# Patient Record
Sex: Female | Born: 1990 | Hispanic: Yes | Marital: Married | Smoking: Never smoker
Health system: Southern US, Community
[De-identification: ages and names within clinical notes are randomized; demographics above are authoritative.]

---

## 2013-07-24 ENCOUNTER — Ambulatory Visit: Payer: Self-pay | Admitting: Family Medicine

## 2013-08-26 ENCOUNTER — Encounter: Payer: Self-pay | Admitting: Maternal & Fetal Medicine

## 2013-11-29 ENCOUNTER — Inpatient Hospital Stay: Payer: Self-pay

## 2013-11-29 LAB — GC/CHLAMYDIA PROBE AMP

## 2013-11-29 LAB — CBC WITH DIFFERENTIAL/PLATELET
BASOS ABS: 0 10*3/uL (ref 0.0–0.1)
Basophil %: 0.4 %
Eosinophil #: 0 10*3/uL (ref 0.0–0.7)
Eosinophil %: 0.5 %
HCT: 39.5 % (ref 35.0–47.0)
HGB: 13.4 g/dL (ref 12.0–16.0)
LYMPHS ABS: 1.6 10*3/uL (ref 1.0–3.6)
Lymphocyte %: 18.3 %
MCH: 33.3 pg (ref 26.0–34.0)
MCHC: 34 g/dL (ref 32.0–36.0)
MCV: 98 fL (ref 80–100)
Monocyte #: 0.8 x10 3/mm (ref 0.2–0.9)
Monocyte %: 8.6 %
Neutrophil #: 6.4 10*3/uL (ref 1.4–6.5)
Neutrophil %: 72.2 %
PLATELETS: 128 10*3/uL — AB (ref 150–440)
RBC: 4.04 10*6/uL (ref 3.80–5.20)
RDW: 14.8 % — ABNORMAL HIGH (ref 11.5–14.5)
WBC: 8.8 10*3/uL (ref 3.6–11.0)

## 2013-11-30 LAB — HEMATOCRIT: HCT: 26.6 % — AB (ref 35.0–47.0)

## 2014-09-16 NOTE — H&P (Signed)
L&D Evaluation:  History:  HPI 24 yo G1P0 with LMP of 02/26/2013 & [facility] 12/03/2013 [facility] with no prenatal problems. Pt presents today for "SROM mof clear fluid at 0615am and no labor". Pt here now and developing UC's becoming stronger and more uncomfortable. Pt is currently walking to enc labor.   Presents with leaking fluid   Patient's Medical History No Chronic Illness   Patient's Surgical History none   Medications Pre Natal Vitamins   Allergies NKDA   Social History none   ROS:  ROS All systems were reviewed.  HEENT, CNS, GI, GU, Respiratory, CV, Renal and Musculoskeletal systems were found to be normal.   Exam:  Vital Signs stable   General no apparent distress   Mental Status clear   Chest clear   Heart normal sinus rhythm, no murmur/gallop/rubs   Abdomen gravid, non-tender   Estimated Fetal Weight Average for gestational age   Back no CVAT   Reflexes 1+   Pelvic 3/70/vtx   Mebranes Ruptured   Description clear   FHT normal rate with no decels, reactive NST   Skin dry   Lymph no lymphadenopathy   Impression:  Impression early labor   Plan:  Plan monitor contractions and for cervical change   Electronic Signatures: Sharee PimpleJones, Aubria Vanecek W (CNM)  (Signed 24-Jul-15 09:29)  Authored: L&D Evaluation   Last Updated: 24-Jul-15 09:29 by Sharee PimpleJones, Lamyra Malcolm W (CNM)

## 2018-12-18 ENCOUNTER — Other Ambulatory Visit: Payer: Self-pay

## 2018-12-18 DIAGNOSIS — Z20822 Contact with and (suspected) exposure to covid-19: Secondary | ICD-10-CM

## 2018-12-19 LAB — NOVEL CORONAVIRUS, NAA: SARS-CoV-2, NAA: NOT DETECTED

## 2019-02-19 LAB — OB RESULTS CONSOLE RPR: RPR: NONREACTIVE

## 2019-02-19 LAB — OB RESULTS CONSOLE VARICELLA ZOSTER ANTIBODY, IGG: Varicella: IMMUNE

## 2019-02-19 LAB — OB RESULTS CONSOLE RUBELLA ANTIBODY, IGM: Rubella: IMMUNE

## 2019-02-19 LAB — OB RESULTS CONSOLE GC/CHLAMYDIA
Chlamydia: NEGATIVE
Gonorrhea: NEGATIVE

## 2019-02-19 LAB — OB RESULTS CONSOLE HEPATITIS B SURFACE ANTIGEN: Hepatitis B Surface Ag: NEGATIVE

## 2019-02-19 LAB — OB RESULTS CONSOLE HIV ANTIBODY (ROUTINE TESTING): HIV: NONREACTIVE

## 2019-04-05 ENCOUNTER — Other Ambulatory Visit: Payer: Self-pay | Admitting: Family Medicine

## 2019-04-05 DIAGNOSIS — Z3482 Encounter for supervision of other normal pregnancy, second trimester: Secondary | ICD-10-CM

## 2019-04-15 ENCOUNTER — Ambulatory Visit
Admission: RE | Admit: 2019-04-15 | Discharge: 2019-04-15 | Disposition: A | Discharge status: Home / Self Care | Payer: Medicaid Other | Source: Ambulatory Visit | Attending: Family Medicine | Admitting: Family Medicine

## 2019-04-15 ENCOUNTER — Other Ambulatory Visit: Payer: Self-pay

## 2019-04-15 DIAGNOSIS — Z3482 Encounter for supervision of other normal pregnancy, second trimester: Secondary | ICD-10-CM | POA: Insufficient documentation

## 2019-05-10 NOTE — L&D Delivery Note (Addendum)
Delivery Note  Date of delivery: 08/31/2019 Estimated Date of Delivery: 09/05/19 Patient's last menstrual period was 11/29/2018. EGA: [redacted]w[redacted]d  First Stage: Augmentation : pitocin Analgesia /Anesthesia intrapartum: none PROM at 2315 on 4/23/221  Casey Floyd presented to L&D with PROM. She was initially expectantly managed, then augmented with pitocin.    Second Stage: Complete dilation at 1525 Onset of pushing at 1525 FHR second stage: category II Delivery at 1552 on 08/31/2019  She progressed to complete and had a spontaneous vaginal birth of a live female, pushing over an intact perineum. The fetal head was delivered in direct OA position with restitution to ROA. No nuchal cord. Anterior then posterior shoulders delivered spontaneously. Baby placed on mom's abdomen and attended to by transition RN. Cord clamped and cut when pulseless by father of the baby. Cord blood obtained for newborn labs.  Third Stage: Placenta delivered spontaneously Duncan intact with 3VC at 1558. Large gush of blood with placenta. Placenta disposition: routine disposal. Uterine tone firm with massage, bleeding brisk from periurethral and vaginal lacerations. Lidocaine injected for anesthesia. IV pitocin given for hemorrhage prophylaxis, along with misoprostol PR. Red rubber catheter used to drain bladder and left in place for periurethral repair. Periurethral laceration repaired with figure of 8 with good hemostasis. Attention then turned to vaginal and perineal lacerations. Right vaginal side wall laceration repaired with running sutures. 2nd degree perineal laceration repaired in the usual fashion.   Periurethral, vaginal, and perineal lacerations identified  Anesthesia for repair: lidocaine Repair: 3-0 Vicryl Rapide CT Quant. Blood Loss (mL): 1554  Complications: hemorrhage  Mom to postpartum.  Baby to Couplet care / Skin to Skin.  Newborn: Birth Weight: pending  Apgar Scores: 8, 9 Feeding  planned: breast and formula   Casey Floyd, CNM 08/31/2019 5:07 PM

## 2019-05-23 ENCOUNTER — Other Ambulatory Visit: Payer: Self-pay | Admitting: Family Medicine

## 2019-05-23 DIAGNOSIS — Z3482 Encounter for supervision of other normal pregnancy, second trimester: Secondary | ICD-10-CM

## 2019-05-28 ENCOUNTER — Ambulatory Visit
Admission: RE | Admit: 2019-05-28 | Discharge: 2019-05-28 | Disposition: A | Discharge status: Home / Self Care | Payer: Medicaid Other | Source: Ambulatory Visit | Attending: Family Medicine | Admitting: Family Medicine

## 2019-05-28 ENCOUNTER — Other Ambulatory Visit: Payer: Self-pay

## 2019-05-28 DIAGNOSIS — Z3482 Encounter for supervision of other normal pregnancy, second trimester: Secondary | ICD-10-CM | POA: Diagnosis present

## 2019-08-31 ENCOUNTER — Other Ambulatory Visit: Payer: Self-pay

## 2019-08-31 ENCOUNTER — Inpatient Hospital Stay
Admission: EM | Admit: 2019-08-31 | Discharge: 2019-09-02 | DRG: 806 | Disposition: A | Discharge status: Home / Self Care | Payer: Medicaid Other | Attending: Certified Nurse Midwife | Admitting: Certified Nurse Midwife

## 2019-08-31 DIAGNOSIS — D62 Acute posthemorrhagic anemia: Secondary | ICD-10-CM | POA: Diagnosis not present

## 2019-08-31 DIAGNOSIS — O4290 Premature rupture of membranes, unspecified as to length of time between rupture and onset of labor, unspecified weeks of gestation: Secondary | ICD-10-CM | POA: Diagnosis present

## 2019-08-31 DIAGNOSIS — Z3A39 39 weeks gestation of pregnancy: Secondary | ICD-10-CM

## 2019-08-31 DIAGNOSIS — O9081 Anemia of the puerperium: Secondary | ICD-10-CM | POA: Diagnosis not present

## 2019-08-31 DIAGNOSIS — Z20822 Contact with and (suspected) exposure to covid-19: Secondary | ICD-10-CM | POA: Diagnosis present

## 2019-08-31 DIAGNOSIS — O4292 Full-term premature rupture of membranes, unspecified as to length of time between rupture and onset of labor: Secondary | ICD-10-CM | POA: Diagnosis present

## 2019-08-31 LAB — CBC
HCT: 35.5 % — ABNORMAL LOW (ref 36.0–46.0)
Hemoglobin: 11.7 g/dL — ABNORMAL LOW (ref 12.0–15.0)
MCH: 31.7 pg (ref 26.0–34.0)
MCHC: 33 g/dL (ref 30.0–36.0)
MCV: 96.2 fL (ref 80.0–100.0)
Platelets: 170 10*3/uL (ref 150–400)
RBC: 3.69 MIL/uL — ABNORMAL LOW (ref 3.87–5.11)
RDW: 15.5 % (ref 11.5–15.5)
WBC: 9.1 10*3/uL (ref 4.0–10.5)
nRBC: 0 % (ref 0.0–0.2)

## 2019-08-31 LAB — RESPIRATORY PANEL BY RT PCR (FLU A&B, COVID)
Influenza A by PCR: NEGATIVE
Influenza B by PCR: NEGATIVE
SARS Coronavirus 2 by RT PCR: NEGATIVE

## 2019-08-31 LAB — TYPE AND SCREEN
ABO/RH(D): O POS
Antibody Screen: NEGATIVE

## 2019-08-31 MED ORDER — OXYTOCIN 40 UNITS IN NORMAL SALINE INFUSION - SIMPLE MED
1.0000 m[IU]/min | INTRAVENOUS | Status: DC
  Administered 2019-08-31: 1 m[IU]/min via INTRAVENOUS
  Filled 2019-08-31: qty 1000

## 2019-08-31 MED ORDER — LACTATED RINGERS IV SOLN: INTRAVENOUS | Status: DC

## 2019-08-31 MED ORDER — LACTATED RINGERS IV SOLN: 500.0000 mL | INTRAVENOUS | Status: DC | PRN

## 2019-08-31 MED ORDER — SENNOSIDES-DOCUSATE SODIUM 8.6-50 MG PO TABS
2.0000 | ORAL_TABLET | ORAL | Status: DC
  Administered 2019-09-01: 2 via ORAL
  Filled 2019-08-31: qty 2

## 2019-08-31 MED ORDER — MISOPROSTOL 200 MCG PO TABS
ORAL_TABLET | ORAL | Status: AC
  Administered 2019-08-31: 800 ug
  Filled 2019-08-31: qty 4

## 2019-08-31 MED ORDER — ONDANSETRON HCL 4 MG/2ML IJ SOLN: 4.0000 mg | Freq: Four times a day (QID) | INTRAMUSCULAR | Status: DC | PRN

## 2019-08-31 MED ORDER — OXYTOCIN 10 UNIT/ML IJ SOLN
INTRAMUSCULAR | Status: AC
  Filled 2019-08-31: qty 2

## 2019-08-31 MED ORDER — ACETAMINOPHEN 325 MG PO TABS
650.0000 mg | ORAL_TABLET | ORAL | Status: DC | PRN
  Administered 2019-09-01 – 2019-09-02 (×3): 650 mg via ORAL
  Filled 2019-08-31 (×3): qty 2

## 2019-08-31 MED ORDER — DIPHENHYDRAMINE HCL 25 MG PO CAPS: 25.0000 mg | ORAL_CAPSULE | Freq: Four times a day (QID) | ORAL | Status: DC | PRN

## 2019-08-31 MED ORDER — MISOPROSTOL 200 MCG PO TABS: 800.0000 ug | ORAL_TABLET | Freq: Once | ORAL | Status: DC

## 2019-08-31 MED ORDER — IBUPROFEN 600 MG PO TABS
600.0000 mg | ORAL_TABLET | Freq: Four times a day (QID) | ORAL | Status: DC
  Administered 2019-08-31 – 2019-09-02 (×7): 600 mg via ORAL
  Filled 2019-08-31 (×7): qty 1

## 2019-08-31 MED ORDER — AMMONIA AROMATIC IN INHA
RESPIRATORY_TRACT | Status: AC
  Filled 2019-08-31: qty 10

## 2019-08-31 MED ORDER — ONDANSETRON HCL 4 MG/2ML IJ SOLN: 4.0000 mg | INTRAMUSCULAR | Status: DC | PRN

## 2019-08-31 MED ORDER — COCONUT OIL OIL
1.0000 "application " | TOPICAL_OIL | Status: DC | PRN
  Administered 2019-09-01: 1 via TOPICAL
  Filled 2019-08-31: qty 120

## 2019-08-31 MED ORDER — BENZOCAINE-MENTHOL 20-0.5 % EX AERO: 1.0000 "application " | INHALATION_SPRAY | CUTANEOUS | Status: DC | PRN

## 2019-08-31 MED ORDER — OXYTOCIN BOLUS FROM INFUSION
500.0000 mL | Freq: Once | INTRAVENOUS | Status: AC
  Administered 2019-08-31: 500 mL via INTRAVENOUS

## 2019-08-31 MED ORDER — ONDANSETRON HCL 4 MG PO TABS: 4.0000 mg | ORAL_TABLET | ORAL | Status: DC | PRN

## 2019-08-31 MED ORDER — FERROUS SULFATE 325 (65 FE) MG PO TABS
325.0000 mg | ORAL_TABLET | Freq: Two times a day (BID) | ORAL | Status: DC
  Administered 2019-08-31 – 2019-09-02 (×4): 325 mg via ORAL
  Filled 2019-08-31 (×4): qty 1

## 2019-08-31 MED ORDER — SOD CITRATE-CITRIC ACID 500-334 MG/5ML PO SOLN: 30.0000 mL | ORAL | Status: DC | PRN

## 2019-08-31 MED ORDER — LIDOCAINE HCL (PF) 1 % IJ SOLN
30.0000 mL | INTRAMUSCULAR | Status: AC | PRN
  Administered 2019-08-31: 30 mL via SUBCUTANEOUS
  Filled 2019-08-31: qty 30

## 2019-08-31 MED ORDER — ACETAMINOPHEN 500 MG PO TABS: 1000.0000 mg | ORAL_TABLET | Freq: Four times a day (QID) | ORAL | Status: DC | PRN

## 2019-08-31 MED ORDER — BUTORPHANOL TARTRATE 1 MG/ML IJ SOLN: 1.0000 mg | INTRAMUSCULAR | Status: DC | PRN

## 2019-08-31 MED ORDER — PRENATAL MULTIVITAMIN CH
1.0000 | ORAL_TABLET | Freq: Every day | ORAL | Status: DC
  Filled 2019-08-31: qty 1

## 2019-08-31 MED ORDER — SIMETHICONE 80 MG PO CHEW: 160.0000 mg | CHEWABLE_TABLET | ORAL | Status: DC | PRN

## 2019-08-31 MED ORDER — WITCH HAZEL-GLYCERIN EX PADS
1.0000 "application " | MEDICATED_PAD | CUTANEOUS | Status: DC
  Administered 2019-08-31: 1 via TOPICAL
  Filled 2019-08-31: qty 100

## 2019-08-31 MED ORDER — DIBUCAINE (PERIANAL) 1 % EX OINT
1.0000 "application " | TOPICAL_OINTMENT | CUTANEOUS | Status: DC | PRN
  Filled 2019-08-31: qty 28

## 2019-08-31 MED ORDER — BENZOCAINE-MENTHOL 20-0.5 % EX AERO
INHALATION_SPRAY | CUTANEOUS | Status: AC
  Filled 2019-08-31: qty 56

## 2019-08-31 MED ORDER — OXYTOCIN 40 UNITS IN NORMAL SALINE INFUSION - SIMPLE MED
2.5000 [IU]/h | INTRAVENOUS | Status: DC
  Filled 2019-08-31: qty 1000

## 2019-08-31 NOTE — Progress Notes (Signed)
Labor Progress Note  Casey Floyd is a 29 y.o. G2P1001 at [redacted]w[redacted]d by LMP c/w [redacted]w[redacted]d ultrasound admitted for rupture of membranes  Subjective:  Starting to feel stronger contractions  Objective: BP 116/77   Pulse (!) 134   Temp 98.3 F (36.8 C) (Oral)   Resp 16   Ht 5\' 2"  (1.575 m)   Wt 75.6 kg   LMP 11/29/2018   SpO2 99%   BMI 30.47 kg/m   Fetal Assessment: FHT:  FHR: 140 bpm, variability: moderate,  accelerations: present, decelerations: variable x1 Category/reactivity:  Category II UC:   regular, every 2-4 minutes SVE:    Dilation: 4-5cm  Effacement: 680%  Station:  -1  Consistency: soft  Position: middle  Membrane status: PROM 08/30/2019 2315 Amniotic color: clear  Labs: Lab Results  Component Value Date   WBC 9.1 08/31/2019   HGB 11.7 (L) 08/31/2019   HCT 35.5 (L) 08/31/2019   MCV 96.2 08/31/2019   PLT 170 08/31/2019    Assessment / Plan: PROM, now in labor  Labor: PROM 08/30/2019 at 2315 (x13.5 hours). Contracting regularly, cervical change since last check. Pitocin infusing at 57mu/min. Continue to titrate PRN per protocol.   Fetal Wellbeing:  Category II for variable decel x1, overall reassuring Pain Control:  Labor support without medications I/D:  n/a Anticipated MOD:  NSVD  4m, CNM 08/31/2019, 12:41 PM

## 2019-08-31 NOTE — H&P (Signed)
OB History & Physical   History of Present Illness:  Chief Complaint:   HPI:  Shadae SHANLEY FURLOUGH is a 29 y.o. G25P1001 female at [redacted]w[redacted]d dated by LMP, c/w [place] at [redacted]w[redacted]d.  She presents to L&D for PROM.   Active FM Denies contractions or cramping  SROM @ 2315 on 08/30/2019  Pregnancy Issues: 1. Family history of Trisomy 55 - cousin was born with Trisomy 69, declined genetic screening  2. Anemia in pregnancy   Patient Active Problem List   Diagnosis Date Noted  . PROM (premature rupture of membranes) 08/31/2019    Maternal Medical History:  History reviewed. No pertinent past medical history.  History reviewed. No pertinent surgical history.  No Known Allergies  Prior to Admission medications   Medication Sig Start Date End Date Taking? Authorizing Provider  ferrous sulfate 325 (65 FE) MG tablet Take 325 mg by mouth daily with breakfast.   Yes [provider]   Pren[facility] care site:  [facility]   Social History: She  reports that she has never smoked. She has never used smokeless tobacco. She reports that she does not drink alcohol or use drugs.  Family History: family history includes Cancer in her paternal grandfather; Diabetes in her paternal grandfather.   Review of Systems: A full review of systems was performed and negative except as noted in the HPI.     Physical Exam:  Vital Signs: BP 121/70 (BP Location: Left Arm)   Pulse 100   Temp 98.3 F (36.8 C) (Oral)   Resp 16   Ht 5\' 2"  (1.575 m)   Wt 75.6 kg   LMP 11/29/2018   SpO2 99%   BMI 30.47 kg/m   General: no acute distress.  HEENT: normocephalic, atraumatic Heart: regular rate & rhythm.  No murmurs/rubs/gallops Lungs: clear to auscultation bilaterally, normal respiratory effort Abdomen: soft, gravid, non-tender;  EFW: 7lbs  Pelvic:   External: Normal external female genitalia  Cervix: Dilation: 3.5 / Effacement (%): 60 / Station: -2    Extremities: non-tender, symmetric,  No edema bilaterally.  DTRs: 2+/2+  Neurologic: Alert & oriented x 3.    Pertinent Results:  Prenatal Labs: Blood type/Rh O pos  Antibody screen neg  Rubella Immune  Varicella Immune  RPR NR  HBsAg Neg  HIV NR  GC neg  Chlamydia neg  Genetic screening Declined   1 hour GTT 68  3 hour GTT NA  GBS Neg   Flu vaccine - given 02/18/2019 TDap vaccine - given at 28 week visit   FHT: 135 bpm, mod variability, accels present, decels absent  TOCO: Occasional  SVE:  Dilation: 3.5 / Effacement (%): 60 / Station: -2    Cephalic by leopolds, confirmed by SVE   No results found.  Assessment:  Khadeejah GALILEAH PIGGEE is a 29 y.o. G38P1001 female at [redacted]w[redacted]d with PROM.   Plan:  1. Admit to Labor & Delivery; consents reviewed and obtained - Covid admission screening per protocol   2. Fetal Well being  - Fetal Tracing: cat 1 - Group B Streptococcus ppx indicated: N/A, GBS neg - Presentation: vertex confirmed by SVE    3. Routine OB: - Prenatal labs reviewed, as above - Rh POS  - CBC, T&S, RPR on admit - Regular diet, saline lock  4. Monitoring of Labor -  External toco in place -  Pelvis proven to 8lbs 5oz -  Reviewed risk/benefits of expectant versus active management.   -  Consents to expectant  management, would prefer for labor to start spontaneously.   -  Open to augmenting with pitocin if remote from labor in 6-8 hours  -  Plan for continuous fetal monitoring  -  Maternal pain control as desired; does not plan for epidural for labor  - Anticipate vaginal delivery  5. Post Partum Planning: - Infant feeding: Breast and formula  - Contraception: TBD   Minda Meo, CNM 08/31/19 1:43 AM

## 2019-08-31 NOTE — Progress Notes (Signed)
Pt rating ctx pain 8/10 scale but still not wanting any pain interventions. She is breathing well through ctx and utilizing position changes to get cope with labor pain. CNM in department and updated on status.

## 2019-08-31 NOTE — Lactation Note (Signed)
This note was copied from a baby's chart. Lactation Consultation Note  Patient Name: Casey Floyd [id number] Date: 08/31/2019 Reason for consult: Initial assessment;Mother's request;Term;Other (Comment)  Mom was still having perineum repaired when entered room.  Casey Floyd is fussy and rooting for the breast.  Gently eased him closer to the breast where after several attempts, he latched well and began good, rhythmic sucking which mom could feel as strong tugs at the breast.  Mom denies any nipple or breast pain, just a little tenderness on initial latch.  He breast fed on the right breast for 24 minutes and came off on his own.  Shortly after coming off the breast, he started fussing and rooting again.  Gently turned him to the left breast, where he once again latched after a few attempts and began strong, rhytyhmic sucking.  Swallows pointed out to mom.  After 30 minutes, Casey Floyd was sustaining the latch on the left breast and sucking intermittently.  Mom was willing to leave him at the breast.  Mom reports having latch issues with her first baby, but he went on to breast feed for 2 years.  Reviewed feeding cues, normal newborn stomach size, supply and demand, normal course of lactation and routine newborn feeding patterns.  Mom on admission put down as her feeding choice to breast and bottle feed.  Discussed risks of introducing the bottle or any artificial nipple too early and encouraged her to just  breast feed.  Praised mom for her commitment to breast feed Casey Floyd.  Encouraged mom to ask for help if needed. Maternal Data Formula Feeding for Exclusion: No Has patient been taught Hand Expression?: Yes Does the patient have breastfeeding experience prior to this delivery?: Yes  Feeding Feeding Type: Breast Fed  LATCH Score Latch: Repeated attempts needed to sustain latch, nipple held in mouth throughout feeding, stimulation needed to elicit sucking reflex.  Audible Swallowing: A few with  stimulation  Type of Nipple: Everted at rest and after stimulation  Comfort (Breast/Nipple): Filling, red/small blisters or bruises, mild/mod discomfort  Hold (Positioning): Assistance needed to correctly position infant at breast and maintain latch.  LATCH Score: 6  Interventions Interventions: Breast feeding basics reviewed;Assisted with latch;Skin to skin;Breast massage;Hand express;Reverse pressure;Breast compression;Adjust position;Support pillows;Position options  Lactation Tools Discussed/Used WIC Program: Yes   Consult Status Consult Status: Follow-up Date: 08/31/19 Follow-up type: Call as needed    Louis Meckel 08/31/2019, 6:16 PM

## 2019-08-31 NOTE — Progress Notes (Signed)
Labor Progress Note  Casey Floyd is a 29 y.o. G2P1001 at [redacted]w[redacted]d by LMP c/w [redacted]w[redacted]d ultrasound admitted for rupture of membranes  Subjective:  Feeling strong contractions. Feeling pressure in her bottom during contractions.   Objective: BP 134/73   Pulse 76   Temp 98.3 F (36.8 C) (Oral)   Resp 16   Ht 5\' 2"  (1.575 m)   Wt 75.6 kg   LMP 11/29/2018   SpO2 99%   BMI 30.47 kg/m   Fetal Assessment: FHT:  FHR: 140 bpm, variability: moderate,  accelerations: present, decelerations: variable x1 Category/reactivity:  Category II UC:   regular, every 2-3 minutes SVE:    Dilation: 8cm  Effacement: 90%  Station:  -1  Consistency: soft  Position: middle  Membrane status: PROM 08/30/2019 2315 Amniotic color: clear  Labs: Lab Results  Component Value Date   WBC 9.1 08/31/2019   HGB 11.7 (L) 08/31/2019   HCT 35.5 (L) 08/31/2019   MCV 96.2 08/31/2019   PLT 170 08/31/2019    Assessment / Plan: PROM, now in labor  Labor: PROM 08/30/2019 at 2315 (x15 hours). Active labor. Pitocin infusing at 28mu/min. Fetal Wellbeing:  Category II for variable decel x1, overall reassuring Pain Control:  Labor support without medications I/D:  n/a Anticipated MOD:  NSVD  9m, CNM 08/31/2019, 2:25 PM

## 2019-08-31 NOTE — Discharge Summary (Signed)
Obstetric Discharge Summary   Patient Name: Casey Floyd DOB: Jun 19, 1990 MRN: [id number]  Date of Admission: 08/31/2019 Date of Delivery: 08/31/2019 Delivered by: Genia Del, CNM Date of Discharge: 09/02/2019  Primary OB: [facility]  XQJ:[id number]'C last menstrual period was 11/29/2018. EDC Estimated Date of Delivery: 09/05/19 Gestational Age at Delivery: [redacted]w[redacted]d   Antepartum complications:  1. Family history of Trisomy 79 - cousin was born with Trisomy 84, declined genetic screening  2. Anemia in pregnancy   Admitting Diagnosis: PROM  Secondary Diagnoses: Patient Active Problem List   Diagnosis Date Noted  . NSVD (normal spontaneous vaginal delivery) 09/02/2019  . Acute blood loss anemia 09/02/2019    Augmentation: Pitocin Complications: Hemorrhage>1058mL Intrapartum complications/course: She presented to L&D with PROM. She was initially expectantly managed, then augmented with pitocin. She progressed to complete and had a spontaneous vaginal birth of a live female, pushing over an intact perineum. The fetal head was delivered in direct OA position with restitution to ROA. No nuchal cord. Anterior then posterior shoulders delivered spontaneously. Baby placed on mom's abdomen and attended to by transition RN. Cord clamped and cut when pulseless by father of the baby. Cord blood obtained for newborn labs. Placenta delivered spontaneously Duncan intact with 3VC at 1558. Large gush of blood with placenta. Placenta disposition: routine disposal. Uterine tone firm with massage, bleeding brisk from periurethral and vaginal lacerations. Lidocaine injected for anesthesia. IV pitocin given for hemorrhage prophylaxis, along with misoprostol PR. Red rubber catheter used to drain bladder and left in place for periurethral repair. Periurethral laceration repaired with figure of 8 with good hemostasis. Attention then turned to vaginal and perineal lacerations. Right  vaginal side wall laceration repaired with running sutures. 2nd degree perineal laceration repaired in the usual fashion.   Quant. Blood Loss (mL): 1554 Delivery Type: spontaneous vaginal delivery Anesthesia: none Placenta: spontaneous Laceration: periurethral, vaginal, 2nd degree perineal Episiotomy: none  Newborn Data: Live born female "Casey Floyd" Birth Weight: 3960g 8lbs 11.7oz APGAR: 8, 9  Newborn Delivery   Birth date/time: 08/31/2019 15:52:00 Delivery type: Vaginal, Spontaneous       Postpartum Course  Patient had an uncomplicated postpartum course.  By time of discharge on PPD#2, her pain was controlled on oral pain medications; she had appropriate lochia and was ambulating, voiding without difficulty and tolerating regular diet.  She was deemed stable for discharge to home.       EdinburghInocente Salles Postnatal Depression Scale Screening Tool 09/01/2019 08/31/2019  I have been able to laugh and see the funny side of things. 0 (No Data)  I have looked forward with enjoyment to things. 0 -  I have blamed myself unnecessarily when things went wrong. 0 -  I have been anxious or worried for no good reason. 0 -  I have felt scared or panicky for no good reason. 0 -  Things have been getting on top of me. 0 -  I have been so unhappy that I have had difficulty sleeping. 0 -  I have felt sad or miserable. 0 -  I have been so unhappy that I have been crying. 0 -  The thought of harming myself has occurred to me. 0 -  Edinburgh Postnatal Depression Scale Total 0 -     Labs: CBC Latest Ref Rng & Units 09/02/2019 09/01/2019 08/31/2019  WBC 4.0 - 10.5 K/uL 10.4 10.4 9.1  Hemoglobin 12.0 - 15.0 g/dL 8.1(L) 8.9(L) 11.7(L)  Hematocrit 36.0 - 46.0 % 25.4(L)  27.1(L) 35.5(L)  Platelets 150 - 400 K/uL 142(L) 144(L) 170   O POS  Physical exam:  BP 105/60 (BP Location: Right Arm)   Pulse 73   Temp 98.4 F (36.9 C) (Axillary)   Resp 20   Ht 5\' 2"  (1.575 m)   Wt 75.6 kg   LMP  11/29/2018   SpO2 99% Comment: Room Air  Breastfeeding Unknown   BMI 30.47 kg/m  General: alert and no distress Pulm: normal respiratory effort Lochia: appropriate Abdomen: soft, NT Uterine Fundus: firm, below umbilicus Extremities: No evidence of DVT seen on physical exam. No lower extremity edema.   Disposition: stable, discharge to home Baby Feeding: breastmilk & formula Baby Disposition: home with mom  Contraception: TBD  Prenatal Labs:  Blood type/Rh O+  Antibody screen neg  Rubella Immune  Varicella Immune  RPR NR  HBsAg Neg  HIV NR  GC neg  Chlamydia neg  Genetic screening declined  1 hour GTT 68  3 hour GTT n/a  GBS negative   Rh Immune globulin given: n/a Rubella vaccine given: n/a Varicella vaccine given: n/a Tdap vaccine given in AP or PP setting: at 28 week visit AP Flu vaccine given in AP or PP setting: 81/27/517  Plan:  Essance TONYA WANTZ was discharged to home in good condition. Follow-up appointment at [place] with delivery provider in 6 weeks  Discharge Instructions: Per After Visit Summary. Activity: Advance as tolerated. Pelvic rest for 6 weeks.   Diet: Regular Discharge Medications: Allergies as of 09/02/2019   No Known Allergies     Medication List    TAKE these medications   acetaminophen 325 MG tablet Commonly known as: Tylenol Take 2 tablets (650 mg total) by mouth every 6 (six) hours as needed (for pain scale < 4).   docusate sodium 100 MG capsule Commonly known as: Colace Take 1 capsule (100 mg total) by mouth daily as needed for mild constipation or moderate constipation.   ferrous sulfate 325 (65 FE) MG tablet Take 1 tablet (325 mg total) by mouth 2 (two) times daily with a meal. What changed: when to take this   ibuprofen 600 MG tablet Commonly known as: ADVIL Take 1 tablet (600 mg total) by mouth every 6 (six) hours as needed for mild pain or moderate pain.   prenatal multivitamin Tabs tablet Take 1  tablet by mouth daily at 12 noon.      Outpatient follow up:  Follow-up Information    Lisette Grinder, CNM. Schedule an appointment as soon as possible for a visit in 6 week(s).   Specialty: Certified Nurse Midwife Why: For routine postpartum visit Contact information: [address] [address] [phone]            Signed: Clydene Laming 09/02/19 9:13 AM

## 2019-08-31 NOTE — OB Triage Note (Signed)
Pt presents c/o SROM around 11:15 p.m. clear fluid with light pink tinge. Pt denies contractions. Reports positive fetal movement. Vitals WNL. Will continue to monitor.

## 2019-08-31 NOTE — Discharge Instructions (Signed)

## 2019-08-31 NOTE — Progress Notes (Signed)
Fetal monitoring removed per verbal order from provider to allow patient to rest. Will continue to monitor.

## 2019-08-31 NOTE — Progress Notes (Signed)
Wireless monitors and belly band applied to allow pt to move more freely. Pt currently rating pain 2/10 and denies any needs at this time.

## 2019-08-31 NOTE — Progress Notes (Signed)
Pt up to bathroom. Wireless monitor tracing maternal HR.

## 2019-08-31 NOTE — Progress Notes (Signed)
Labor Progress Note  Casey Floyd is a 29 y.o. G2P1001 at [redacted]w[redacted]d by LMP c/w [redacted]w[redacted]d ultrasound admitted for rupture of membranes  Subjective:  Feeling contractions. Starting to get a little bit stronger.   Objective: BP 118/68   Pulse 90   Temp 98.8 F (37.1 C) (Oral)   Resp 14   Ht 5\' 2"  (1.575 m)   Wt 75.6 kg   LMP 11/29/2018   SpO2 99%   BMI 30.47 kg/m   Fetal Assessment: FHT:  FHR: 135 bpm, variability: moderate,  accelerations:  Absent,  decelerations:  Absent Category/reactivity:  Category I UC:   irregular, every 2-9 minutes SVE:    Dilation: 3-4cm  Effacement: 60%  Station:  -2  Consistency: medium  Position: posterior  Membrane status: PROM 08/30/2019 2315 Amniotic color: clear  Labs: Lab Results  Component Value Date   WBC 9.1 08/31/2019   HGB 11.7 (L) 08/31/2019   HCT 35.5 (L) 08/31/2019   MCV 96.2 08/31/2019   PLT 170 08/31/2019    Assessment / Plan: PROM  Labor: PROM 08/30/2019 at 2315 (almost 10 hours ago). Contracting mildly and irregularly. Cervix unchanged since admission. Plan to start pitocin for augmentation and titrate per protocol to adequate contraction pattern.  Fetal Wellbeing:  Category I Pain Control:  Labor support without medications I/D:  n/a Anticipated MOD:  NSVD  09/01/2019, CNM 08/31/2019, 8:58 AM

## 2019-09-01 LAB — CBC
HCT: 27.1 % — ABNORMAL LOW (ref 36.0–46.0)
Hemoglobin: 8.9 g/dL — ABNORMAL LOW (ref 12.0–15.0)
MCH: 32.1 pg (ref 26.0–34.0)
MCHC: 32.8 g/dL (ref 30.0–36.0)
MCV: 97.8 fL (ref 80.0–100.0)
Platelets: 144 10*3/uL — ABNORMAL LOW (ref 150–400)
RBC: 2.77 MIL/uL — ABNORMAL LOW (ref 3.87–5.11)
RDW: 15.7 % — ABNORMAL HIGH (ref 11.5–15.5)
WBC: 10.4 10*3/uL (ref 4.0–10.5)
nRBC: 0 % (ref 0.0–0.2)

## 2019-09-01 LAB — RPR: RPR Ser Ql: NONREACTIVE

## 2019-09-01 MED ORDER — SODIUM CHLORIDE 0.9 % IV SOLN
200.0000 mg | Freq: Once | INTRAVENOUS | Status: AC
  Administered 2019-09-01: 200 mg via INTRAVENOUS
  Filled 2019-09-01: qty 10

## 2019-09-01 NOTE — Progress Notes (Signed)
Post Partum Day 1  Subjective: Doing well, no concerns. Ambulating without difficulty, pain managed with PO meds, tolerating regular diet, and voiding without difficulty. Some dizziness here and there when getting up.  No fever/chills, chest pain, shortness of breath, nausea/vomiting, or leg pain. No nipple or breast pain.   Objective: BP 104/62 (BP Location: Right Arm)   Pulse 75   Temp 98.3 F (36.8 C) (Oral)   Resp 20   Ht 5\' 2"  (1.575 m)   Wt 75.6 kg   LMP 11/29/2018   SpO2 99% Comment: Room Air  Breastfeeding Unknown   BMI 30.47 kg/m    Physical Exam:  General: alert, cooperative, appears stated age and no distress Breasts: soft/nontender CV: RRR Pulm: nl effort, CTABL Abdomen: soft, non-tender, active bowel sounds Uterine Fundus: firm Perineum: repair well approximated Lochia: appropriate DVT Evaluation: No evidence of DVT seen on physical exam. No cords or calf tenderness. No significant calf/ankle edema.  Recent Labs    08/31/19 0131 09/01/19 0706  HGB 11.7* 8.9*  HCT 35.5* 27.1*  WBC 9.1 10.4  PLT 170 144*    Assessment/Plan: 29 y.o. 09/03/19 postpartum day # 1  -Continue routine postpartum care -Lactation consult PRN for breastfeeding  -Acute blood loss anemia: appropriate hemoglobin drop, vital signs WNL, mildly symptomatic. continue PO ferrous sulfate BID with stool softeners, one dose of IV Venofer. Repeat CBC in the morning -Immunization status: all immunizations up to date  Disposition: Continue inpatient postpartum care   LOS: 1 day   [id number], CNM 09/01/2019, 10:31 AM   ----- 09/03/2019 Certified Nurse Midwife [facility]

## 2019-09-01 NOTE — Lactation Note (Signed)
This note was copied from a baby's chart. Lactation Consultation Note  Patient Name: Casey Floyd [id number] Date: 09/01/2019 Reason for consult: Follow-up assessment;Term  LC and LC student completed a follow-up visit with [name]. Baby "Casey Floyd" was laying on mom's chest and was rooting around the right breast. FOB was resting on the couch. LC assisted with helping baby latch to the right breast in cross cradle hold. After a couple of attempts, Casey Floyd was able to maintain a deep latch. Audible swallows and spontaneous jaw movement were observed. [name] reported that the latch felt more comfortable than prior feeds. Casey Floyd was still actively feeding upon LC and [facility] student exit.   LC reviewed breastfeeding basics and provided information about outpatient resources/supports. MOB reported that she had no further questions or concerns at this time. She stated that she knows to call out to Lactation if needed.   Maternal Data Has patient been taught Hand Expression?: Yes Does the patient have breastfeeding experience prior to this delivery?: Yes  Feeding Feeding Type: Breast Fed  LATCH Score Latch: Grasps breast easily, tongue down, lips flanged, rhythmical sucking.  Audible Swallowing: Spontaneous and intermittent  Type of Nipple: Everted at rest and after stimulation  Comfort (Breast/Nipple): Soft / non-tender  Hold (Positioning): Assistance needed to correctly position infant at breast and maintain latch.  LATCH Score: 9  Interventions Interventions: Breast feeding basics reviewed;Assisted with latch;Breast massage;Breast compression;Support pillows  Lactation Tools Discussed/Used     Consult Status Consult Status: Follow-up Date: 09/02/19 Follow-up type: In-patient    Gregery Na 09/01/2019, 1:33 PM

## 2019-09-02 DIAGNOSIS — D62 Acute posthemorrhagic anemia: Secondary | ICD-10-CM | POA: Diagnosis not present

## 2019-09-02 LAB — CBC
HCT: 25.4 % — ABNORMAL LOW (ref 36.0–46.0)
Hemoglobin: 8.1 g/dL — ABNORMAL LOW (ref 12.0–15.0)
MCH: 31.8 pg (ref 26.0–34.0)
MCHC: 31.9 g/dL (ref 30.0–36.0)
MCV: 99.6 fL (ref 80.0–100.0)
Platelets: 142 10*3/uL — ABNORMAL LOW (ref 150–400)
RBC: 2.55 MIL/uL — ABNORMAL LOW (ref 3.87–5.11)
RDW: 15.9 % — ABNORMAL HIGH (ref 11.5–15.5)
WBC: 10.4 10*3/uL (ref 4.0–10.5)
nRBC: 0 % (ref 0.0–0.2)

## 2019-09-02 MED ORDER — IBUPROFEN 600 MG PO TABS: 600.0000 mg | ORAL_TABLET | Freq: Four times a day (QID) | ORAL | 0 refills | Status: DC | PRN

## 2019-09-02 MED ORDER — FERROUS SULFATE 325 (65 FE) MG PO TABS: 325.0000 mg | ORAL_TABLET | Freq: Two times a day (BID) | ORAL | Status: DC

## 2019-09-02 MED ORDER — DOCUSATE SODIUM 100 MG PO CAPS: 100.0000 mg | ORAL_CAPSULE | Freq: Every day | ORAL | Status: AC | PRN

## 2019-09-02 MED ORDER — PRENATAL MULTIVITAMIN CH: 1.0000 | ORAL_TABLET | Freq: Every day | ORAL | Status: AC

## 2019-09-02 MED ORDER — ACETAMINOPHEN 325 MG PO TABS: 650.0000 mg | ORAL_TABLET | Freq: Four times a day (QID) | ORAL | Status: DC | PRN

## 2019-09-02 NOTE — Lactation Note (Signed)
This note was copied from a baby's chart. Lactation Consultation Note  Patient Name: Boy Camy Dray [id number] Date: 09/02/2019   Marland Kitchen is cluster feeding.  Observed good breast feed with Marland Kitchen latching with minimal assistance with strong, rhythmic sucking and audible swallows.  Mom's nipples are tender and breasts are feeling a little fuller before feeding and softer after breast feed.  Coconut oil and comfort gels given with instructions in alternating use.  Mom had a manual pump that she used with her first baby and was requesting one for this baby.  Breast pump kit given with instructions in manual and electric pumping.  There is no need to pump at present since Marland Kitchen is breast feeding so well.  Discussed breast massage, hand expression, when and how to pump, collection, storage, labeling, cleaning and handling expressed milk.  Benjamin's transcutaneous bilirubin at 36 hours was 10.6 and serum was 10.  Encouraged mom to continue to breast feed with cueing to bring in mature milk, ensure a plentiful milk supply and prevent engorgement and jaundice.  Mom and baby to be discharged today.  Lactation Geophysicist/field seismologist given with contact numbers and reviewed, encouraging mom to call with any questions, concerns or assistance.       Maternal Data    Feeding    LATCH Score                   Interventions    Lactation Tools Discussed/Used     Consult Status      Jarold Motto 09/02/2019, 3:17 PM

## 2020-02-26 ENCOUNTER — Other Ambulatory Visit: Payer: Self-pay

## 2020-02-26 ENCOUNTER — Emergency Department: Payer: Medicaid Other

## 2020-02-26 ENCOUNTER — Emergency Department
Admission: EM | Admit: 2020-02-26 | Discharge: 2020-02-26 | Disposition: A | Discharge status: Home / Self Care | Payer: Medicaid Other | Attending: Emergency Medicine | Admitting: Emergency Medicine

## 2020-02-26 DIAGNOSIS — G510 Bell's palsy: Secondary | ICD-10-CM

## 2020-02-26 DIAGNOSIS — R200 Anesthesia of skin: Secondary | ICD-10-CM | POA: Diagnosis present

## 2020-02-26 MED ORDER — PREDNISONE 20 MG PO TABS
60.0000 mg | ORAL_TABLET | Freq: Once | ORAL | Status: AC
  Administered 2020-02-26: 60 mg via ORAL
  Filled 2020-02-26: qty 3

## 2020-02-26 MED ORDER — VALACYCLOVIR HCL 500 MG PO TABS
1000.0000 mg | ORAL_TABLET | Freq: Once | ORAL | Status: AC
  Administered 2020-02-26: 1000 mg via ORAL
  Filled 2020-02-26: qty 2

## 2020-02-26 MED ORDER — PREDNISONE 10 MG PO TABS
60.0000 mg | ORAL_TABLET | Freq: Every day | ORAL | 0 refills | Status: AC
Start: 2020-02-26 — End: 2020-03-02

## 2020-02-26 MED ORDER — VALACYCLOVIR HCL 1 G PO TABS: 1000.0000 mg | ORAL_TABLET | Freq: Three times a day (TID) | ORAL | 0 refills | Status: AC

## 2020-02-26 NOTE — ED Triage Notes (Signed)
Pt comes unable to close her right eye. Eye is dry and watering. Started yesterday morning.

## 2020-02-26 NOTE — ED Provider Notes (Signed)
[facility] Emergency Department Provider Note  ____________________________________________  Time seen: Approximately 7:52 PM  I have reviewed the triage vital signs and the nursing notes.   HISTORY  Chief Complaint Eye Problem    HPI Casey Floyd is a 29 y.o. female that presents to the emergency department for evaluation of the inability to raise right eyebrow, inability to fully close her right eye and drooping of right lip today.  Patient also states that she has numbness and tingling to the right side of her tongue.  Her eye has been watering more than usual today.  Patient denies any trauma.  She has had no recent illness.  She has had no recent vaccinations.  She did receive a Covid MRNA vaccine in July.  No fever, weakness.    History reviewed. No pertinent past medical history.  Patient Active Problem List   Diagnosis Date Noted  . NSVD (normal spontaneous vaginal delivery) 09/02/2019  . Acute blood loss anemia 09/02/2019    History reviewed. No pertinent surgical history.  Prior to Admission medications   Medication Sig Start Date End Date Taking? Authorizing Provider  acetaminophen (TYLENOL) 325 MG tablet Take 2 tablets (650 mg total) by mouth every 6 (six) hours as needed (for pain scale < 4). 09/02/19   Haroldine Laws, CNM  docusate sodium (COLACE) 100 MG capsule Take 1 capsule (100 mg total) by mouth daily as needed for mild constipation or moderate constipation. 09/02/19 09/01/20  Haroldine Laws, CNM  ferrous sulfate 325 (65 FE) MG tablet Take 1 tablet (325 mg total) by mouth 2 (two) times daily with a meal. 09/02/19   Haroldine Laws, CNM  ibuprofen (ADVIL) 600 MG tablet Take 1 tablet (600 mg total) by mouth every 6 (six) hours as needed for mild pain or moderate pain. 09/02/19   Haroldine Laws, CNM  predniSONE (DELTASONE) 10 MG tablet Take 6 tablets (60 mg total) by mouth daily for 5 days. 02/26/20 03/02/20  Enid Derry, PA-C   Prenatal Vit-Fe Fumarate-FA (PRENATAL MULTIVITAMIN) TABS tablet Take 1 tablet by mouth daily at 12 noon. 09/02/19   Haroldine Laws, CNM  valACYclovir (VALTREX) 1000 MG tablet Take 1 tablet (1,000 mg total) by mouth 3 (three) times daily for 7 days. 02/26/20 03/04/20  Enid Derry, PA-C    Allergies Patient has no known allergies.  Family History  Problem Relation Age of Onset  . Cancer Paternal Grandfather   . Diabetes Paternal Grandfather     Social History Social History   Tobacco Use  . Smoking status: Never Smoker  . Smokeless tobacco: Never Used  Vaping Use  . Vaping Use: Never used  Substance Use Topics  . Alcohol use: Never  . Drug use: Never     Review of Systems  Constitutional: No fever/chills ENT: No upper respiratory complaints. Cardiovascular: No chest pain. Respiratory:  No SOB. Gastrointestinal: No abdominal pain.  No nausea, no vomiting.  Musculoskeletal: Negative for musculoskeletal pain. Skin: Negative for rash, abrasions, lacerations, ecchymosis. Neurological: Negative for headache   ____________________________________________   PHYSICAL EXAM:  VITAL SIGNS: ED Triage Vitals  Enc Vitals Group     BP 02/26/20 1855 135/78     Pulse Rate 02/26/20 1855 97     Resp 02/26/20 1855 17     Temp 02/26/20 1855 98.7 F (37.1 C)     Temp Source 02/26/20 1855 Oral     SpO2 02/26/20 1855 95 %     Weight 02/26/20 1852 150 lb (68  kg)     Height 02/26/20 1852 5\' 2"  (1.575 m)     Head Circumference --      Peak Flow --      Pain Score 02/26/20 1852 0     Pain Loc --      Pain Edu? --      Excl. in GC? --      Constitutional: Alert and oriented. Well appearing and in no acute distress. Eyes: Conjunctivae are normal. PERRL. EOMI. Difficulty closing right eye. Head: Unable to completely close right eyelid.  Unable to raise right eyebrow.  Unable to furrow right brow.  Inability to wrinkle right forehead.  Droop of right lip.  Unable to purse  lips. ENT:      Ears:      Nose: No congestion/rhinnorhea.      Mouth/Throat: Mucous membranes are moist.  Neck: No stridor.  Cardiovascular: Normal rate, regular rhythm.  Good peripheral circulation. Respiratory: Normal respiratory effort without tachypnea or retractions. Lungs CTAB. Good air entry to the bases with no decreased or absent breath sounds. Gastrointestinal: Bowel sounds 4 quadrants. Soft and nontender to palpation. No guarding or rigidity. No palpable masses. No distention.  Musculoskeletal: Full range of motion to all extremities. No gross deformities appreciated. Neurologic: Normal speech and language.  Strength 5/5 in upper and lower extremities Cerebellar: Finger-nose-finger WNL, Heel to shin WNL Sensorimotor: No pronator drift, clonus, sensory loss or abnormal reflexes. No vision deficits noted to confrontation bilaterally.  Speech: No dysarthria or expressive aphasia Skin:  Skin is warm, dry and intact. No rash noted. Psychiatric: Mood and affect are normal. Speech and behavior are normal. Patient exhibits appropriate insight and judgement.   ____________________________________________   LABS (all labs ordered are listed, but only abnormal results are displayed)  Labs Reviewed - No data to display ____________________________________________  EKG   ____________________________________________  RADIOLOGY   CT Head Wo Contrast  Result Date: 02/26/2020 CLINICAL DATA:  29 year old female with headache, "Unable to move the right side of her face". EXAM: CT HEAD WITHOUT CONTRAST TECHNIQUE: Contiguous axial images were obtained from the base of the skull through the vertex without intravenous contrast. COMPARISON:  None. FINDINGS: Brain: Cerebral volume is within normal limits. No midline shift, ventriculomegaly, mass effect, evidence of mass lesion, intracranial hemorrhage or evidence of cortically based acute infarction. Gray-white matter differentiation is  within normal limits throughout the brain. Vascular: No suspicious intracranial vascular hyperdensity. Skull: Negative. Sinuses/Orbits: Visualized paranasal sinuses and mastoids are clear. Other: Visualized orbits and scalp soft tissues are within normal limits. Right stylomastoid foramen appears normal. IMPRESSION: Normal noncontrast CT appearance of the brain. Electronically Signed   By: 26 M.D.   On: 02/26/2020 20:29    ____________________________________________    PROCEDURES  Procedure(s) performed:    Procedures    Medications  valACYclovir (VALTREX) tablet 1,000 mg (1,000 mg Oral Given 02/26/20 2140)  predniSONE (DELTASONE) tablet 60 mg (60 mg Oral Given 02/26/20 2140)     ____________________________________________   INITIAL IMPRESSION / ASSESSMENT AND PLAN / ED COURSE  Pertinent labs & imaging results that were available during my care of the patient were reviewed by me and considered in my medical decision making (see chart for details).  Review of the [place] CSRS was performed in accordance of the [facility] prior to dispensing any controlled drugs.    Patient's diagnosis is consistent with Bell's palsy.  Vital signs and exam are reassuring.  CT head is negative for acute abnormalities.  Exam is consistent with Bell's palsy.  Patient was given a dose of Valtrex and prednisone in the emergency department.  Patient will be discharged home with prescriptions for prednisone and Valtrex. Patient is to follow up with neurology as directed. Patient is given ED precautions to return to the ED for any worsening or new symptoms.   Casey Floyd was evaluated in Emergency Department on 02/26/2020 for the symptoms described in the history of present illness. She was evaluated in the context of the global COVID-19 pandemic, which necessitated consideration that the patient might be at risk for infection with the SARS-CoV-2 virus that causes COVID-19. Institutional protocols and  algorithms that pertain to the evaluation of patients at risk for COVID-19 are in a state of rapid change based on information released by regulatory bodies including the CDC and federal and state organizations. These policies and algorithms were followed during the patient's care in the ED.   ____________________________________________  FINAL CLINICAL IMPRESSION(S) / ED DIAGNOSES  Final diagnoses:  Bell's palsy      NEW MEDICATIONS STARTED DURING THIS VISIT:  ED Discharge Orders         Ordered    predniSONE (DELTASONE) 10 MG tablet  Daily        02/26/20 2110    valACYclovir (VALTREX) 1000 MG tablet  3 times daily        02/26/20 2110              This chart was dictated using voice recognition software/Dragon. Despite best efforts to proofread, errors can occur which can change the meaning. Any change was purely unintentional.    Enid Derry, PA-C 02/26/20 2310    Willy Eddy, MD 03/02/20 [phone]

## 2020-05-29 ENCOUNTER — Other Ambulatory Visit: Payer: Self-pay

## 2020-05-29 ENCOUNTER — Encounter: Payer: Self-pay | Admitting: *Deleted

## 2020-05-29 ENCOUNTER — Emergency Department
Admission: EM | Admit: 2020-05-29 | Discharge: 2020-05-29 | Disposition: A | Discharge status: Home / Self Care | Payer: Medicaid Other | Attending: Emergency Medicine | Admitting: Emergency Medicine

## 2020-05-29 DIAGNOSIS — T7840XA Allergy, unspecified, initial encounter: Secondary | ICD-10-CM | POA: Diagnosis not present

## 2020-05-29 MED ORDER — FAMOTIDINE IN NACL 20-0.9 MG/50ML-% IV SOLN
20.0000 mg | Freq: Once | INTRAVENOUS | Status: AC
  Administered 2020-05-29: 20 mg via INTRAVENOUS
  Filled 2020-05-29: qty 50

## 2020-05-29 MED ORDER — EPINEPHRINE 0.3 MG/0.3ML IJ SOAJ: 0.3000 mg | INTRAMUSCULAR | 1 refills | Status: AC | PRN

## 2020-05-29 MED ORDER — METHYLPREDNISOLONE SODIUM SUCC 125 MG IJ SOLR
125.0000 mg | Freq: Once | INTRAMUSCULAR | Status: AC
  Administered 2020-05-29: 125 mg via INTRAVENOUS
  Filled 2020-05-29: qty 2

## 2020-05-29 MED ORDER — PREDNISONE 20 MG PO TABS: 40.0000 mg | ORAL_TABLET | Freq: Every day | ORAL | 0 refills | Status: AC

## 2020-05-29 MED ORDER — DIPHENHYDRAMINE HCL 50 MG/ML IJ SOLN
50.0000 mg | Freq: Once | INTRAMUSCULAR | Status: AC
  Administered 2020-05-29: 50 mg via INTRAVENOUS
  Filled 2020-05-29: qty 1

## 2020-05-29 NOTE — ED Provider Notes (Signed)
[facility] Emergency Department Provider Note  ____________________________________________  Time seen: Approximately 4:34 AM  I have reviewed the triage vital signs and the nursing notes.   HISTORY  Chief Complaint Allergic Reaction   HPI Casey Floyd is a 30 y.o. female who presents for evaluation of an allergic reaction. Patient reports that her symptoms started 3 days ago. Initially she had pruritus. Then she had hives. Then today she started having a tightness in her throat. She denies any new medications, new foods, new detergents, new beauty products. She denies any prior history of anaphylaxis. She denies difficulty breathing, vomiting or diarrhea. She denies any viral illness. Patient received IV Pepcid, Solu-Medrol, and Benadryl in triage and reports that her symptoms are now fully resolved.   History reviewed. No pertinent past medical history.  Patient Active Problem List   Diagnosis Date Noted  . NSVD (normal spontaneous vaginal delivery) 09/02/2019  . Acute blood loss anemia 09/02/2019    History reviewed. No pertinent surgical history.  Prior to Admission medications   Medication Sig Start Date End Date Taking? Authorizing Provider  EPINEPHrine 0.3 mg/0.3 mL IJ SOAJ injection Inject 0.3 mg into the muscle as needed for anaphylaxis. 05/29/20  Yes Don Perking, [place], MD  predniSONE (DELTASONE) 20 MG tablet Take 2 tablets (40 mg total) by mouth daily with breakfast for 4 days. 05/29/20 06/02/20 Yes Don Perking, [place], MD  acetaminophen (TYLENOL) 325 MG tablet Take 2 tablets (650 mg total) by mouth every 6 (six) hours as needed (for pain scale < 4). 09/02/19   Haroldine Laws, CNM  docusate sodium (COLACE) 100 MG capsule Take 1 capsule (100 mg total) by mouth daily as needed for mild constipation or moderate constipation. 09/02/19 09/01/20  Haroldine Laws, CNM  ferrous sulfate 325 (65 FE) MG tablet Take 1 tablet (325 mg total) by mouth 2  (two) times daily with a meal. 09/02/19   Haroldine Laws, CNM  ibuprofen (ADVIL) 600 MG tablet Take 1 tablet (600 mg total) by mouth every 6 (six) hours as needed for mild pain or moderate pain. 09/02/19   Haroldine Laws, CNM  Prenatal Vit-Fe Fumarate-FA (PRENATAL MULTIVITAMIN) TABS tablet Take 1 tablet by mouth daily at 12 noon. 09/02/19   Haroldine Laws, CNM    Allergies Patient has no known allergies.  Family History  Problem Relation Age of Onset  . Cancer Paternal Grandfather   . Diabetes Paternal Grandfather     Social History Social History   Tobacco Use  . Smoking status: Never Smoker  . Smokeless tobacco: Never Used  Vaping Use  . Vaping Use: Never used  Substance Use Topics  . Alcohol use: Never  . Drug use: Never    Review of Systems  Constitutional: Negative for fever. Eyes: Negative for visual changes. ENT: Negative for sore throat. + throat tightness Neck: No neck pain  Cardiovascular: Negative for chest pain. Respiratory: Negative for shortness of breath. Gastrointestinal: Negative for abdominal pain, vomiting or diarrhea. Genitourinary: Negative for dysuria. Musculoskeletal: Negative for back pain. Skin: + hives. Neurological: Negative for headaches, weakness or numbness. Psych: No SI or HI  ____________________________________________   PHYSICAL EXAM:  VITAL SIGNS: Vitals:   05/29/20 0022 05/29/20 0420  BP: 122/73 124/72  Pulse: (!) 109 89  Resp: 16 16  Temp: 97.7 F (36.5 C)   SpO2: 98% 100%    Constitutional: Alert and oriented. Well appearing and in no apparent distress. HEENT:      Head: Normocephalic and atraumatic.  Eyes: Conjunctivae are normal. Sclera is non-icteric.       Mouth/Throat: Mucous membranes are moist. Oropharynx is clear, uvula is midline, no tongue swelling, no uvula swelling, no stridor, airways patent      Neck: Supple with no signs of meningismus. Cardiovascular: Regular rate and rhythm. No murmurs,  gallops, or rubs. Respiratory: Normal respiratory effort. Lungs are clear to auscultation bilaterally. No wheezes, crackles, or rhonchi.  Gastrointestinal: Soft, non tender. Musculoskeletal: No edema, cyanosis, or erythema of extremities. Neurologic: Normal speech and language. Face is symmetric. Moving all extremities. No gross focal neurologic deficits are appreciated. Skin: Skin is warm, dry and intact. No rash noted. Psychiatric: Mood and affect are normal. Speech and behavior are normal.  ____________________________________________   LABS (all labs ordered are listed, but only abnormal results are displayed)  Labs Reviewed - No data to display ____________________________________________  EKG  none  ____________________________________________  RADIOLOGY  none  ____________________________________________   PROCEDURES  Procedure(s) performed: None Procedures Critical Care performed:  None ____________________________________________   INITIAL IMPRESSION / ASSESSMENT AND PLAN / ED COURSE   30 y.o. female who presents for evaluation of an allergic reaction x 3 days. Patient received IV Solu-Medrol, IV Pepcid, and IV Benadryl with full resolution of her symptoms. Patient was monitored in the ED for greater than 4 hours after receiving those medications with no signs of recurrent allergic reaction. No signs of anaphylaxis. Will discharge home on prednisone and a prescription for EpiPen was also given. Discussed indications for EpiPen and how to administer it. Recommended close follow-up with primary care doctor for referral to an allergist. Discussed my standard return precautions.       _____________________________________________ Please note:  Patient was evaluated in Emergency Department today for the symptoms described in the history of present illness. Patient was evaluated in the context of the global COVID-19 pandemic, which necessitated consideration that the  patient might be at risk for infection with the SARS-CoV-2 virus that causes COVID-19. Institutional protocols and algorithms that pertain to the evaluation of patients at risk for COVID-19 are in a state of rapid change based on information released by regulatory bodies including the CDC and federal and state organizations. These policies and algorithms were followed during the patient's care in the ED.  Some ED evaluations and interventions may be delayed as a result of limited staffing during the pandemic.   [place] Controlled Substance Database was reviewed by me. ____________________________________________   FINAL CLINICAL IMPRESSION(S) / ED DIAGNOSES   Final diagnoses:  Allergic reaction, initial encounter      NEW MEDICATIONS STARTED DURING THIS VISIT:  ED Discharge Orders         Ordered    predniSONE (DELTASONE) 20 MG tablet  Daily with breakfast        05/29/20 0434    EPINEPHrine 0.3 mg/0.3 mL IJ SOAJ injection  As needed        05/29/20 0434           Note:  This document was prepared using Dragon voice recognition software and may include unintentional dictation errors.    Nita Sickle, MD 05/29/20 [phone]

## 2020-05-29 NOTE — ED Triage Notes (Signed)
Pt to ED reporting hives, redness and itchiness to her chest and hands that started yesterday that has progressed to a "weird tight" feeling in her throat. No SOB, swelling or redness to her face.  No voice changes. Last dose of benadryl was yesterday. No known allergens.

## 2020-05-29 NOTE — ED Notes (Signed)
Redness and hives have improved and pt reports decreased itchiness. Pt also reporting her throat feels back to normal.

## 2021-02-03 LAB — OB RESULTS CONSOLE HEPATITIS B SURFACE ANTIGEN: Hepatitis B Surface Ag: NEGATIVE

## 2021-02-03 LAB — OB RESULTS CONSOLE RPR: RPR: NONREACTIVE

## 2021-02-03 LAB — OB RESULTS CONSOLE VARICELLA ZOSTER ANTIBODY, IGG: Varicella: IMMUNE

## 2021-02-03 LAB — OB RESULTS CONSOLE RUBELLA ANTIBODY, IGM: Rubella: IMMUNE

## 2021-03-12 ENCOUNTER — Other Ambulatory Visit: Payer: Self-pay | Admitting: Physician Assistant

## 2021-03-12 DIAGNOSIS — Z3689 Encounter for other specified antenatal screening: Secondary | ICD-10-CM

## 2021-04-13 ENCOUNTER — Other Ambulatory Visit: Payer: Self-pay

## 2021-04-13 DIAGNOSIS — O0990 Supervision of high risk pregnancy, unspecified, unspecified trimester: Secondary | ICD-10-CM

## 2021-04-13 DIAGNOSIS — Z3402 Encounter for supervision of normal first pregnancy, second trimester: Secondary | ICD-10-CM

## 2021-04-15 ENCOUNTER — Other Ambulatory Visit: Payer: Self-pay

## 2021-04-15 ENCOUNTER — Ambulatory Visit (HOSPITAL_BASED_OUTPATIENT_CLINIC_OR_DEPARTMENT_OTHER): Payer: Medicaid Other

## 2021-04-15 ENCOUNTER — Other Ambulatory Visit
Admission: RE | Admit: 2021-04-15 | Discharge: 2021-04-15 | Disposition: A | Discharge status: Home / Self Care | Payer: Medicaid Other | Source: Ambulatory Visit | Attending: Obstetrics and Gynecology | Admitting: Obstetrics and Gynecology

## 2021-04-15 DIAGNOSIS — Z3A25 25 weeks gestation of pregnancy: Secondary | ICD-10-CM

## 2021-04-15 DIAGNOSIS — O0990 Supervision of high risk pregnancy, unspecified, unspecified trimester: Secondary | ICD-10-CM

## 2021-04-15 DIAGNOSIS — O0992 Supervision of high risk pregnancy, unspecified, second trimester: Secondary | ICD-10-CM | POA: Insufficient documentation

## 2021-04-15 DIAGNOSIS — Z3689 Encounter for other specified antenatal screening: Secondary | ICD-10-CM | POA: Insufficient documentation

## 2021-04-15 DIAGNOSIS — Z363 Encounter for antenatal screening for malformations: Secondary | ICD-10-CM | POA: Insufficient documentation

## 2021-04-19 LAB — MATERNIT21 PLUS CORE+SCA
Fetal Fraction: 11
Monosomy X (Turner Syndrome): NOT DETECTED
Result (T21): NEGATIVE
Trisomy 13 (Patau syndrome): NEGATIVE
Trisomy 18 (Edwards syndrome): NEGATIVE
Trisomy 21 (Down syndrome): NEGATIVE
XXX (Triple X Syndrome): NOT DETECTED
XXY (Klinefelter Syndrome): NOT DETECTED
XYY (Jacobs Syndrome): NOT DETECTED

## 2021-04-20 ENCOUNTER — Telehealth: Payer: Self-pay | Admitting: Obstetrics and Gynecology

## 2021-04-20 NOTE — Telephone Encounter (Signed)
The patient was informed of the results of her recent MaterniT21 testing which yielded NEGATIVE results.  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  The sensitivity for trisomy 46, trisomy 34 and trisomy 65 using this testing are reported as 99.1%, 99.9% and 91.7% respectively.  Thus, while the results of this testing are highly accurate, they are not considered diagnostic at this time.  Should more definitive information be desired, the patient may still consider amniocentesis.   As requested to know by the patient, sex chromosome analysis was included for this sample.  Results are consistent with a female fetus. This is predicted with >99% accuracy. This testing also screens for sex chromosome conditions with greater than 96% accuracy and was negative for those conditions.    The patient is currently [redacted] weeks gestation and therefore too far along for AFP screening for open neural tube defects.  We may be reached at [phone] with any questions or concerns.  Cherly Anderson, MS, CGC

## 2021-04-22 IMAGING — CT CT HEAD W/O CM
3 series · 15 of 46 positions shown, 18 images · non-contrast
Comparison: None.

CLINICAL DATA: 29-year-old female with headache, "Unable to move
the right side of her face".

EXAM:
CT HEAD WITHOUT CONTRAST
TECHNIQUE: Contiguous axial images were obtained from the base of the skull
through the vertex without intravenous contrast.

[Series 2: head wo · axial · 0.40mm/px · z∈[-136,-16]mm · 9 of 29 slices shown, 12 images]
[im 3/29  brain]
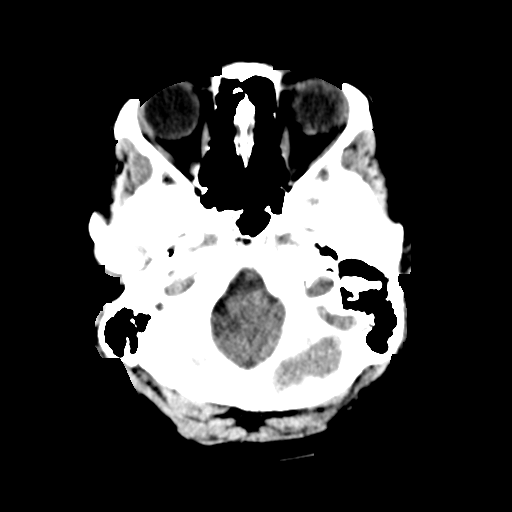
[im 3/29  bone]
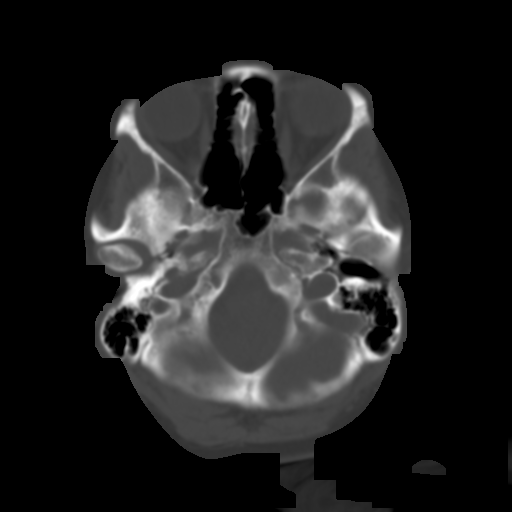
[im 6/29  brain]
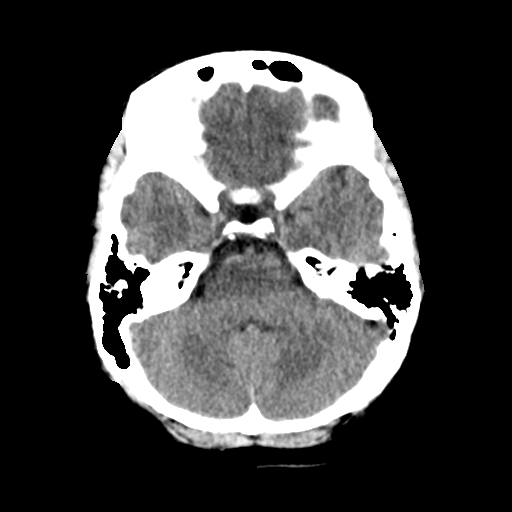
[im 9/29  brain]
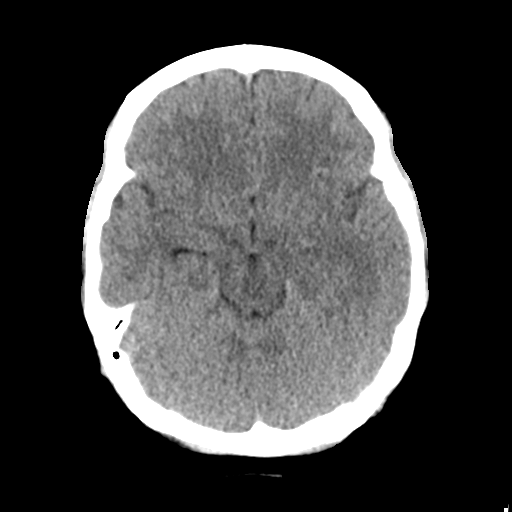
[im 12/29  brain]
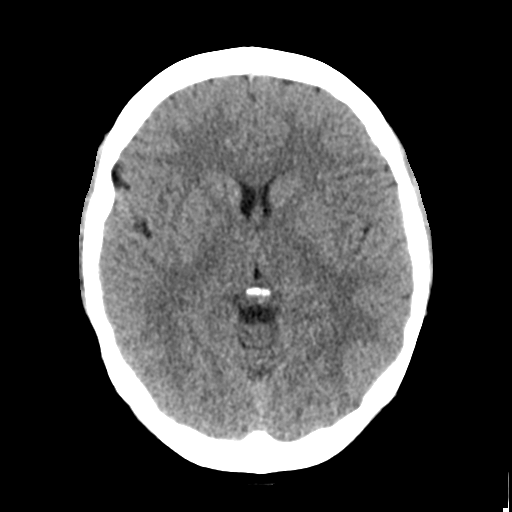
[im 15/29  brain]
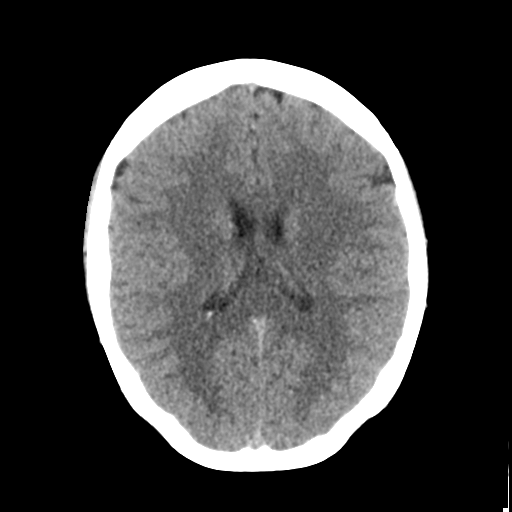
[im 15/29  bone]
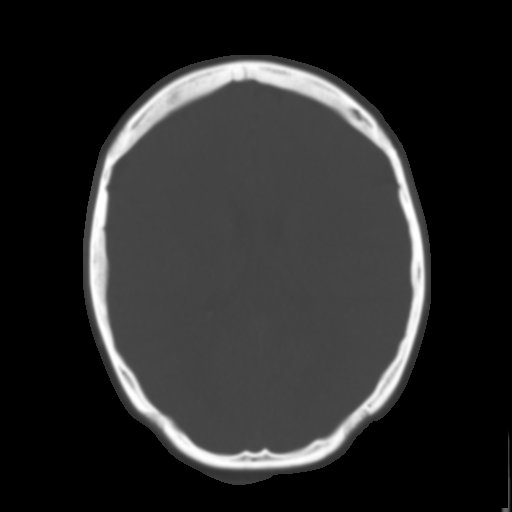
[im 18/29  brain]
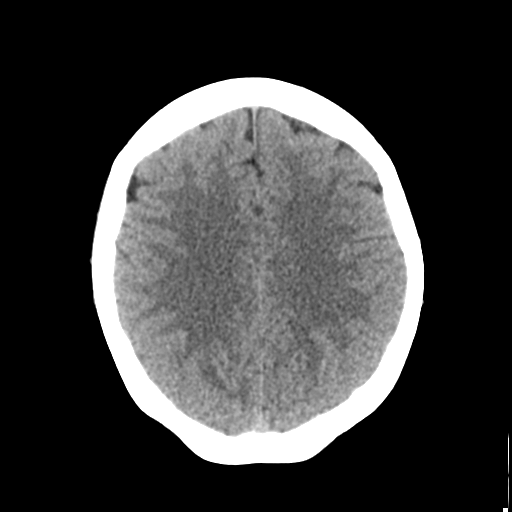
[im 21/29  brain]
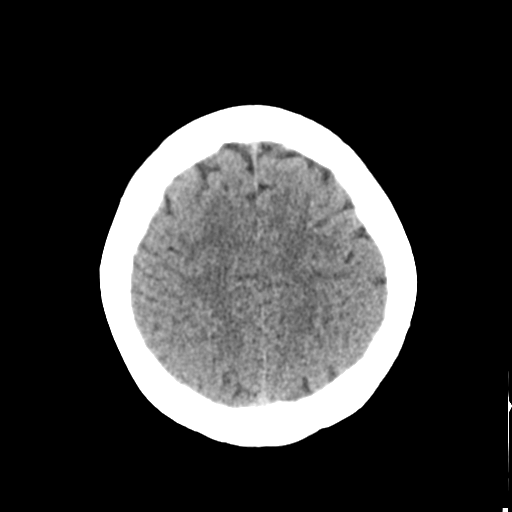
[im 24/29  brain]
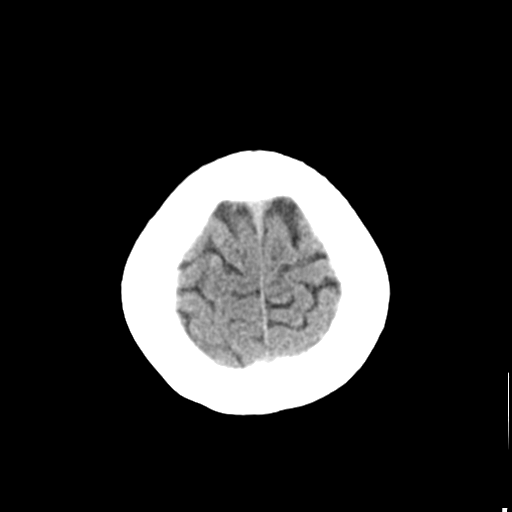
[im 27/29  brain]
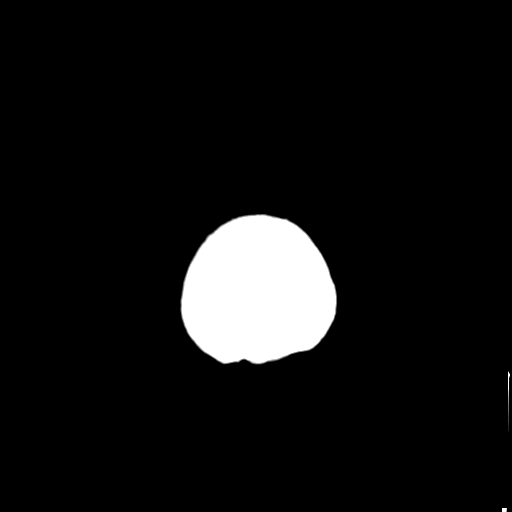
[im 27/29  bone]
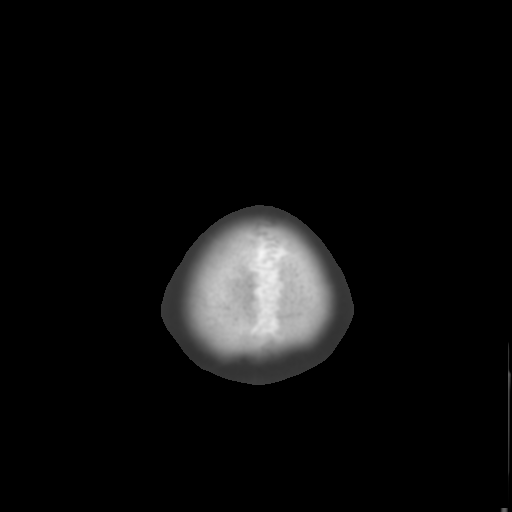

[Series 4: coronal soft tissue · coronal · 0.29mm/px · 3 of 60 slices shown]
[im 20/60  brain]
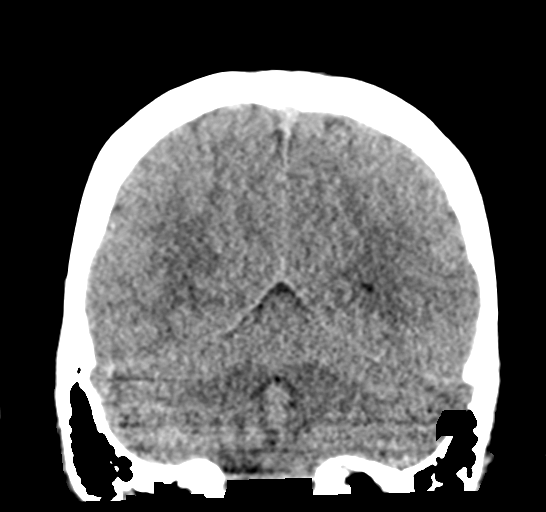
[im 27/60  brain]
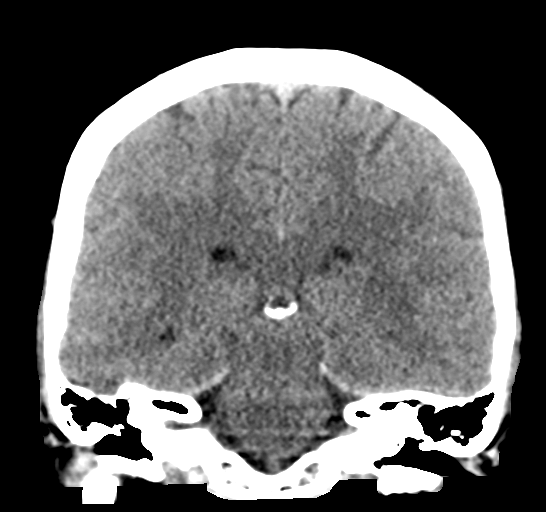
[im 33/60  brain]
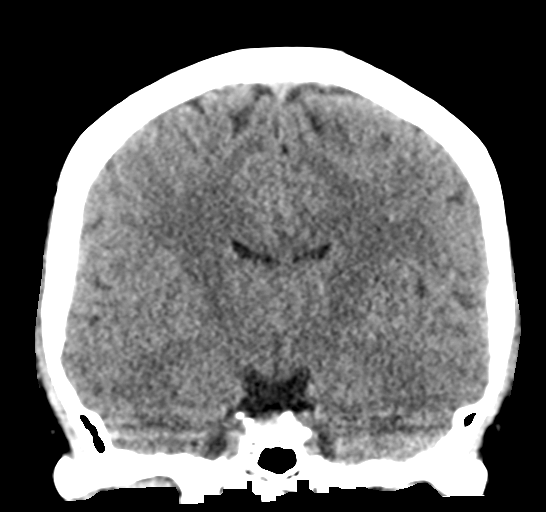

[Series 5: sagittal soft tissue · sagittal · 0.29mm/px · 3 of 51 slices shown]
[im 17/51  brain]
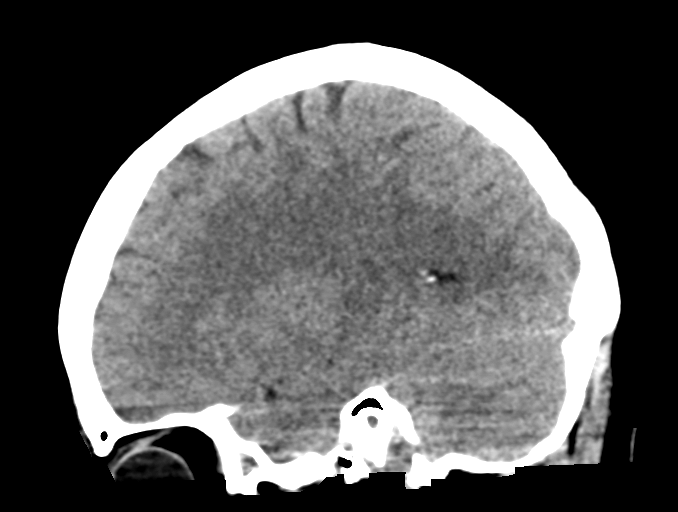
[im 26/51  brain]
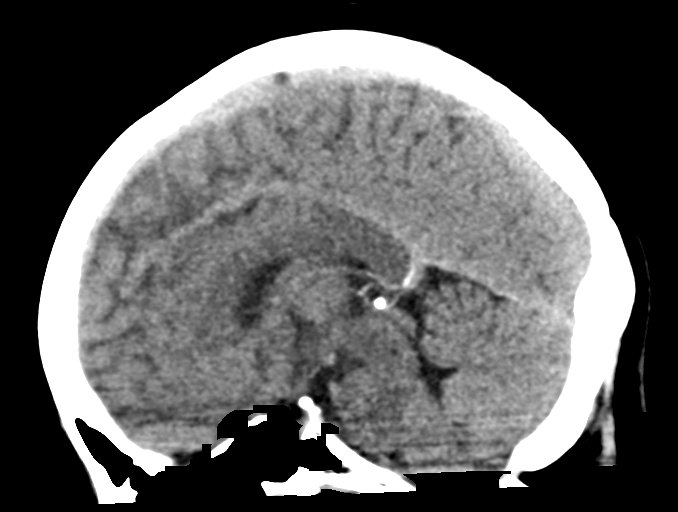
[im 34/51  brain]
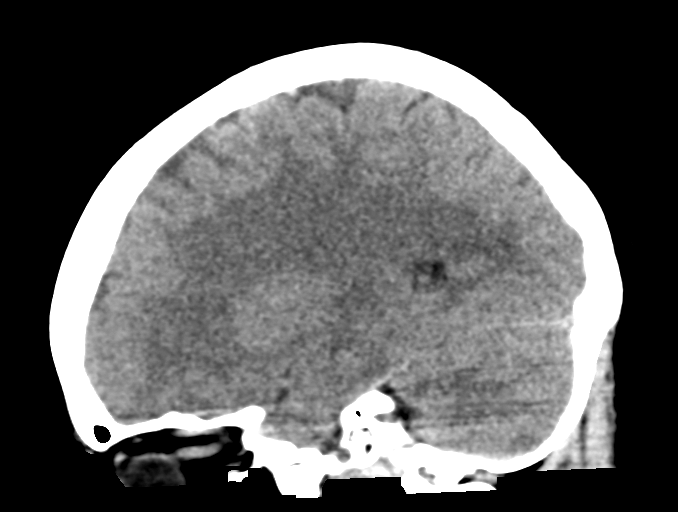

[15 of 46 positions shown; findings below may reference images not displayed]

FINDINGS: Brain: Cerebral volume is within normal limits. No midline shift,
ventriculomegaly, mass effect, evidence of mass lesion, intracranial
hemorrhage or evidence of cortically based acute infarction.
Gray-white matter differentiation is within normal limits throughout
the brain.

Vascular: No suspicious intracranial vascular hyperdensity.

Skull: Negative.

Sinuses/Orbits: Visualized paranasal sinuses and mastoids are clear.

Other: Visualized orbits and scalp soft tissues are within normal
limits. Right stylomastoid foramen appears normal.
IMPRESSION: Normal noncontrast CT appearance of the brain.

## 2021-05-09 NOTE — L&D Delivery Note (Signed)
Delivery Note ?Called back to the labor room less than 30 minutes after performing AROM. Patient was complete and pushing. At 1644, a viable female was delivered vaginally. The vertex presented direct OA, with restitution to ROT. The anterior shoulder was delivered with gentle downward guidance , followed by delivery of the posterior shoulder using upward guidance. .  APGAR:8 ,9, ; weight  8lbs 11 ozs.   ?Placenta status: delivered intact ,  .  Cord: 3 vessel  with the following complications: postpartum bleed with placental delivery, controlled with vigorous fundal massage, then IV pitocin, IM Methergine and rectal cytotec .  A larger amount of bleeding occurred just prior to the placental delivery, followed by copious bleeding that resolved with the above treatments. ? ?Anesthesia:  Local with Lidocaine 1% ?Episiotomy:  none ?Lacerations:  bilateral vaginal lacerations and first degree perineal. ?Suture Repair: 3.0 vicryl rapide ?Est. Blood Loss (mL):  ? ?Mom to postpartum.  Baby to Couplet care / Skin to Skin. ? ?Casey Floyd ?07/20/2021, 6:51 PM ? ? ?

## 2021-05-19 ENCOUNTER — Other Ambulatory Visit: Payer: Self-pay

## 2021-05-19 DIAGNOSIS — O0990 Supervision of high risk pregnancy, unspecified, unspecified trimester: Secondary | ICD-10-CM

## 2021-05-20 ENCOUNTER — Other Ambulatory Visit: Payer: Self-pay

## 2021-05-20 ENCOUNTER — Ambulatory Visit: Payer: Medicaid Other | Attending: Obstetrics and Gynecology

## 2021-05-20 DIAGNOSIS — O0990 Supervision of high risk pregnancy, unspecified, unspecified trimester: Secondary | ICD-10-CM

## 2021-05-20 DIAGNOSIS — Z3A30 30 weeks gestation of pregnancy: Secondary | ICD-10-CM | POA: Diagnosis not present

## 2021-05-20 DIAGNOSIS — Z362 Encounter for other antenatal screening follow-up: Secondary | ICD-10-CM | POA: Diagnosis not present

## 2021-05-20 DIAGNOSIS — O0993 Supervision of high risk pregnancy, unspecified, third trimester: Secondary | ICD-10-CM | POA: Insufficient documentation

## 2021-06-30 ENCOUNTER — Other Ambulatory Visit: Payer: Self-pay

## 2021-06-30 DIAGNOSIS — O0990 Supervision of high risk pregnancy, unspecified, unspecified trimester: Secondary | ICD-10-CM

## 2021-07-01 ENCOUNTER — Ambulatory Visit: Payer: Medicaid Other | Attending: Obstetrics

## 2021-07-01 ENCOUNTER — Other Ambulatory Visit: Payer: Self-pay

## 2021-07-01 DIAGNOSIS — O0990 Supervision of high risk pregnancy, unspecified, unspecified trimester: Secondary | ICD-10-CM

## 2021-07-01 DIAGNOSIS — Z362 Encounter for other antenatal screening follow-up: Secondary | ICD-10-CM | POA: Diagnosis not present

## 2021-07-01 DIAGNOSIS — Z3A36 36 weeks gestation of pregnancy: Secondary | ICD-10-CM

## 2021-07-06 LAB — OB RESULTS CONSOLE HIV ANTIBODY (ROUTINE TESTING): HIV: NONREACTIVE

## 2021-07-07 LAB — OB RESULTS CONSOLE GC/CHLAMYDIA
Chlamydia: NEGATIVE
Gonorrhea: NEGATIVE

## 2021-07-07 LAB — OB RESULTS CONSOLE GBS: GBS: NEGATIVE

## 2021-07-20 ENCOUNTER — Inpatient Hospital Stay
Admission: EM | Admit: 2021-07-20 | Discharge: 2021-07-21 | DRG: 806 | Disposition: A | Discharge status: Home / Self Care | Payer: Medicaid Other | Attending: Obstetrics and Gynecology | Admitting: Obstetrics and Gynecology

## 2021-07-20 ENCOUNTER — Other Ambulatory Visit: Payer: Self-pay

## 2021-07-20 DIAGNOSIS — O09293 Supervision of pregnancy with other poor reproductive or obstetric history, third trimester: Secondary | ICD-10-CM

## 2021-07-20 DIAGNOSIS — Z20822 Contact with and (suspected) exposure to covid-19: Secondary | ICD-10-CM | POA: Diagnosis present

## 2021-07-20 DIAGNOSIS — Z3A39 39 weeks gestation of pregnancy: Secondary | ICD-10-CM | POA: Diagnosis not present

## 2021-07-20 DIAGNOSIS — O9081 Anemia of the puerperium: Secondary | ICD-10-CM | POA: Diagnosis not present

## 2021-07-20 DIAGNOSIS — D62 Acute posthemorrhagic anemia: Secondary | ICD-10-CM | POA: Diagnosis not present

## 2021-07-20 DIAGNOSIS — O479 False labor, unspecified: Principal | ICD-10-CM | POA: Diagnosis present

## 2021-07-20 DIAGNOSIS — O26893 Other specified pregnancy related conditions, third trimester: Principal | ICD-10-CM | POA: Diagnosis present

## 2021-07-20 LAB — CBC
HCT: 39.2 % (ref 36.0–46.0)
HCT: 29.5 % — ABNORMAL LOW (ref 36.0–46.0)
Hemoglobin: 13 g/dL (ref 12.0–15.0)
Hemoglobin: 9.8 g/dL — ABNORMAL LOW (ref 12.0–15.0)
MCH: 30.7 pg (ref 26.0–34.0)
MCH: 30.8 pg (ref 26.0–34.0)
MCHC: 33.2 g/dL (ref 30.0–36.0)
MCHC: 33.2 g/dL (ref 30.0–36.0)
MCV: 92.5 fL (ref 80.0–100.0)
MCV: 92.8 fL (ref 80.0–100.0)
Platelets: 163 10*3/uL (ref 150–400)
Platelets: 158 10*3/uL (ref 150–400)
RBC: 4.24 MIL/uL (ref 3.87–5.11)
RBC: 3.18 MIL/uL — ABNORMAL LOW (ref 3.87–5.11)
RDW: 14.9 % (ref 11.5–15.5)
RDW: 14.8 % (ref 11.5–15.5)
WBC: 9.3 10*3/uL (ref 4.0–10.5)
WBC: 15.2 10*3/uL — ABNORMAL HIGH (ref 4.0–10.5)
nRBC: 0 % (ref 0.0–0.2)
nRBC: 0 % (ref 0.0–0.2)

## 2021-07-20 LAB — RESP PANEL BY RT-PCR (FLU A&B, COVID) ARPGX2
Influenza A by PCR: NEGATIVE
Influenza B by PCR: NEGATIVE
SARS Coronavirus 2 by RT PCR: NEGATIVE

## 2021-07-20 LAB — TYPE AND SCREEN
ABO/RH(D): O POS
Antibody Screen: NEGATIVE

## 2021-07-20 MED ORDER — ZOLPIDEM TARTRATE 5 MG PO TABS: 5.0000 mg | ORAL_TABLET | Freq: Every evening | ORAL | Status: DC | PRN

## 2021-07-20 MED ORDER — WITCH HAZEL-GLYCERIN EX PADS
1.0000 "application " | MEDICATED_PAD | CUTANEOUS | Status: DC | PRN
  Administered 2021-07-20: 1 via TOPICAL
  Filled 2021-07-20: qty 100

## 2021-07-20 MED ORDER — SIMETHICONE 80 MG PO CHEW: 80.0000 mg | CHEWABLE_TABLET | ORAL | Status: DC | PRN

## 2021-07-20 MED ORDER — DIBUCAINE (PERIANAL) 1 % EX OINT: 1.0000 "application " | TOPICAL_OINTMENT | CUTANEOUS | Status: DC | PRN

## 2021-07-20 MED ORDER — OXYTOCIN BOLUS FROM INFUSION
333.0000 mL | Freq: Once | INTRAVENOUS | Status: AC
  Administered 2021-07-20: 333 mL via INTRAVENOUS

## 2021-07-20 MED ORDER — OXYCODONE HCL 5 MG PO TABS: 5.0000 mg | ORAL_TABLET | ORAL | Status: DC | PRN

## 2021-07-20 MED ORDER — OXYCODONE HCL 5 MG PO TABS: 10.0000 mg | ORAL_TABLET | ORAL | Status: DC | PRN

## 2021-07-20 MED ORDER — ACETAMINOPHEN 325 MG PO TABS
650.0000 mg | ORAL_TABLET | ORAL | Status: DC | PRN
  Administered 2021-07-20 – 2021-07-21 (×3): 650 mg via ORAL
  Filled 2021-07-20 (×3): qty 2

## 2021-07-20 MED ORDER — IBUPROFEN 600 MG PO TABS
600.0000 mg | ORAL_TABLET | Freq: Four times a day (QID) | ORAL | Status: DC
  Administered 2021-07-20 – 2021-07-21 (×4): 600 mg via ORAL
  Filled 2021-07-20 (×4): qty 1

## 2021-07-20 MED ORDER — AMMONIA AROMATIC IN INHA
RESPIRATORY_TRACT | Status: AC
  Filled 2021-07-20: qty 10

## 2021-07-20 MED ORDER — OXYCODONE-ACETAMINOPHEN 5-325 MG PO TABS: 2.0000 | ORAL_TABLET | ORAL | Status: DC | PRN

## 2021-07-20 MED ORDER — LACTATED RINGERS IV SOLN: 500.0000 mL | INTRAVENOUS | Status: DC | PRN

## 2021-07-20 MED ORDER — COCONUT OIL OIL: 1.0000 "application " | TOPICAL_OIL | Status: DC | PRN

## 2021-07-20 MED ORDER — LACTATED RINGERS IV SOLN: INTRAVENOUS | Status: DC

## 2021-07-20 MED ORDER — BENZOCAINE-MENTHOL 20-0.5 % EX AERO
1.0000 "application " | INHALATION_SPRAY | CUTANEOUS | Status: DC | PRN
  Administered 2021-07-20: 1 via TOPICAL
  Filled 2021-07-20: qty 56

## 2021-07-20 MED ORDER — MISOPROSTOL 200 MCG PO TABS: 800.0000 ug | ORAL_TABLET | ORAL | Status: AC

## 2021-07-20 MED ORDER — OXYCODONE-ACETAMINOPHEN 5-325 MG PO TABS: 1.0000 | ORAL_TABLET | ORAL | Status: DC | PRN

## 2021-07-20 MED ORDER — LIDOCAINE HCL (PF) 1 % IJ SOLN
30.0000 mL | INTRAMUSCULAR | Status: AC | PRN
  Administered 2021-07-20: 30 mL via SUBCUTANEOUS

## 2021-07-20 MED ORDER — FERROUS SULFATE 325 (65 FE) MG PO TABS
325.0000 mg | ORAL_TABLET | Freq: Two times a day (BID) | ORAL | Status: DC
  Administered 2021-07-21 (×2): 325 mg via ORAL
  Filled 2021-07-20 (×2): qty 1

## 2021-07-20 MED ORDER — TETANUS-DIPHTH-ACELL PERTUSSIS 5-2.5-18.5 LF-MCG/0.5 IM SUSY
0.5000 mL | PREFILLED_SYRINGE | Freq: Once | INTRAMUSCULAR | Status: DC
  Filled 2021-07-20: qty 0.5

## 2021-07-20 MED ORDER — DOCUSATE SODIUM 100 MG PO CAPS
100.0000 mg | ORAL_CAPSULE | Freq: Two times a day (BID) | ORAL | Status: DC
  Administered 2021-07-21: 100 mg via ORAL
  Filled 2021-07-20: qty 1

## 2021-07-20 MED ORDER — OXYTOCIN 10 UNIT/ML IJ SOLN
INTRAMUSCULAR | Status: AC
  Filled 2021-07-20: qty 2

## 2021-07-20 MED ORDER — ONDANSETRON HCL 4 MG/2ML IJ SOLN: 4.0000 mg | INTRAMUSCULAR | Status: DC | PRN

## 2021-07-20 MED ORDER — LIDOCAINE HCL (PF) 1 % IJ SOLN
INTRAMUSCULAR | Status: AC
  Filled 2021-07-20: qty 30

## 2021-07-20 MED ORDER — ONDANSETRON HCL 4 MG PO TABS: 4.0000 mg | ORAL_TABLET | ORAL | Status: DC | PRN

## 2021-07-20 MED ORDER — METHYLERGONOVINE MALEATE 0.2 MG/ML IJ SOLN: 0.2000 mg | INTRAMUSCULAR | Status: AC

## 2021-07-20 MED ORDER — METHYLERGONOVINE MALEATE 0.2 MG/ML IJ SOLN
INTRAMUSCULAR | Status: AC
  Administered 2021-07-20: 0.2 mg
  Filled 2021-07-20: qty 1

## 2021-07-20 MED ORDER — ACETAMINOPHEN 325 MG PO TABS: 650.0000 mg | ORAL_TABLET | ORAL | Status: DC | PRN

## 2021-07-20 MED ORDER — SOD CITRATE-CITRIC ACID 500-334 MG/5ML PO SOLN: 30.0000 mL | ORAL | Status: DC | PRN

## 2021-07-20 MED ORDER — ONDANSETRON HCL 4 MG/2ML IJ SOLN: 4.0000 mg | Freq: Four times a day (QID) | INTRAMUSCULAR | Status: DC | PRN

## 2021-07-20 MED ORDER — DIPHENHYDRAMINE HCL 25 MG PO CAPS: 25.0000 mg | ORAL_CAPSULE | Freq: Four times a day (QID) | ORAL | Status: DC | PRN

## 2021-07-20 MED ORDER — BUTORPHANOL TARTRATE 1 MG/ML IJ SOLN: 1.0000 mg | INTRAMUSCULAR | Status: DC | PRN

## 2021-07-20 MED ORDER — PRENATAL MULTIVITAMIN CH
1.0000 | ORAL_TABLET | Freq: Every day | ORAL | Status: DC
  Administered 2021-07-21: 1 via ORAL
  Filled 2021-07-20: qty 1

## 2021-07-20 MED ORDER — MISOPROSTOL 200 MCG PO TABS
ORAL_TABLET | ORAL | Status: AC
  Administered 2021-07-20: 800 ug via RECTAL
  Filled 2021-07-20: qty 3

## 2021-07-20 MED ORDER — OXYTOCIN-SODIUM CHLORIDE 30-0.9 UT/500ML-% IV SOLN
2.5000 [IU]/h | INTRAVENOUS | Status: DC
  Administered 2021-07-20: 2.5 [IU]/h via INTRAVENOUS
  Filled 2021-07-20: qty 500

## 2021-07-20 MED ORDER — CARBOPROST TROMETHAMINE 250 MCG/ML IM SOLN
INTRAMUSCULAR | Status: AC
  Filled 2021-07-20: qty 1

## 2021-07-20 NOTE — OB Triage Note (Addendum)
Pt Casey Floyd 31 y.o. presents to labor and delivery triage reporting ctx. Pt reports ctx started at 0200 that were irregular but reports more frequent ctx this morning reporting pain 1/10. Pt is a [id number] at [redacted]w[redacted]d. Pt denies signs and symptons consistent with rupture of membranes or active vaginal bleeding. Pt  states positive fetal movement. External FM and TOCO applied to non-tender abdomen and assessing. Initial FHR 140 . Vital signs obtained and within normal limits. Provider notified of pt. ? ?

## 2021-07-20 NOTE — Progress Notes (Signed)
Casey Floyd is a 31 y.o. G3P2002 at [redacted]w[redacted]d now admitted in latent labor.  ? ?Subjective:she is quiet and caslm, and reports ? ? ?Objective: ?BP 118/80 (BP Location: Right Arm)   Pulse 99   Temp 98.4 ?F (36.9 ?C) (Oral)   Resp 18   Ht 5\' 2"  (1.575 m)   Wt 83 kg   LMP 10/18/2020   BMI 33.47 kg/m?  ?No intake/output data recorded. ?No intake/output data recorded. ? ?FHT:  FHR: 135 bpm, variability: moderateto marked at times,  accelerations:  Present,  decelerations:  Present  occasional brief variable ?UC:   regular, every 4-5 minutes ?SVE:   Dilation: 6 ?Effacement (%): 90 ?Station: -3 ?Exam by:: M.Sayed Apostol,CNM ? ?Labs: ?Lab Results  ?Component Value Date  ? WBC 9.3 07/20/2021  ? HGB 13.0 07/20/2021  ? HCT 39.2 07/20/2021  ? MCV 92.5 07/20/2021  ? PLT 163 07/20/2021  ? ? ?Assessment / Plan: ?Spontaneous labor, progressing normallyAROM performed- clear fluid seen ? ?Labor: Progressing normally ? ?Fetal Wellbeing:  Category II ?Pain Control:  Labor support without medications ?I/D:  n/a ?Anticipated MOD:  NSVD ? ?07/22/2021 ?07/20/2021, 4:15 PM ? ? ?

## 2021-07-20 NOTE — H&P (Addendum)
Casey Floyd is a 31 y.o. female presenting at 48 weeks and 2 days gestation with regular contractions that started last evening..This is her third baby, and she prefers to labor unmedicated. She received prenatal care at the [facility]. ?OB History   ? ? Gravida  ?3  ? Para  ?2  ? Term  ?2  ? Preterm  ?   ? AB  ?   ? Living  ?2  ?  ? ? SAB  ?   ? IAB  ?   ? Ectopic  ?   ? Multiple  ?0  ? Live Births  ?2  ?   ?  ?  ? ?History reviewed. No pertinent past medical history. ?History reviewed. No pertinent surgical history. ?Family History: family history includes Cancer in her paternal grandfather; Diabetes in her paternal grandfather; Healthy in her father and mother. ?Social History:  reports that she has never smoked. She has never used smokeless tobacco. She reports that she does not drink alcohol and does not use drugs. ? ? ?  ?Maternal Diabetes: No ?Genetic Screening: Normal ?Maternal Ultrasounds/Referrals: Normal ?Fetal Ultrasounds or other Referrals:  Referred to Materal Fetal Medicine  ?Maternal Substance Abuse:  No ?Significant Maternal Medications:  None ?Significant Maternal Lab Results:  Group B Strep negative ?Other Comments:  None ? ?Review of Systems ?History ?Dilation: 4.5 ?Effacement (%): 80 ?Station: -3 ?Exam by:: Paula Compton CNM ?Blood pressure 109/75, pulse (!) 104, temperature 98.4 ?F (36.9 ?C), temperature source Oral, resp. rate 18, height 5\' 2"  (1.575 m), weight 83 kg, last menstrual period 10/18/2020, unknown if currently breastfeeding. ?Maternal Exam:  ?Introitus: Normal vulva.  ?Physical Exam ?Constitutional:   ?   Appearance: Normal appearance.  ?HENT:  ?   Head: Normocephalic and atraumatic.  ?Cardiovascular:  ?   Rate and Rhythm: Normal rate and regular rhythm.  ?   Pulses: Normal pulses.  ?   Heart sounds: Normal heart sounds.  ?Pulmonary:  ?   Effort: Pulmonary effort is normal.  ?   Breath sounds: Normal breath sounds.  ?Abdominal:  ?   Comments:  Gravid abdomen  ?Genitourinary: ?   General: Normal vulva.  ?   Rectum: Normal.  ?   Comments: See vaginal exam.  ?Musculoskeletal:     ?   General: Normal range of motion.  ?   Cervical back: Normal range of motion and neck supple.  ?Skin: ?   General: Skin is warm and dry.  ?Neurological:  ?   General: No focal deficit present.  ?   Mental Status: She is alert and oriented to person, place, and time.  ?Psychiatric:     ?   Mood and Affect: Mood normal.     ?   Behavior: Behavior normal.  ?  ?Prenatal labs: ?No results found for this or any previous visit (from the past 24 hour(s)). ? ?ABO, Rh:  O+ ?Antibody:  negative ?Rubella:  immune ?RPR:   immune ?HBsAg:   negative ?HIV:   NR ?GBS:   negative ? ?Assessment/Plan: ?IUP at 39 weeks 2 days ?Latent labor ?Hx of larger babies ?GBS negative ?Admitted to labor and Delivery ?COVID testing ordered ?EFM ?IV access ?She prefers to walk for several hours; would then consider AROM. ?Routine admission labs. ?Will recheck in a few hours. ? ?12/18/2020, CNM  ?07/20/2021 12:08 PM  ?  ? ?07/22/2021 ?07/20/2021, 11:59 AM ? ? ? ? ?

## 2021-07-20 NOTE — Discharge Summary (Signed)
? ?  Postpartum Discharge Summary ? ?Date of Service updated 07/21/2021 ? ?   ?Patient Name: Casey Floyd ?DOB: 1990/06/06 ?MRN: [id number] ? ?Date of admission: 07/20/2021 ?Delivery date:07/20/2021  ?Delivering provider: Imagene Riches  ?Date of discharge: 07/21/2021 ? ?Admitting diagnosis: Uterine contractions [O47.9] ?Intrauterine pregnancy: [redacted]w[redacted]d    ?Secondary diagnosis:  Active Problems: ?  Postpartum care following vaginal delivery ?  Postpartum hemorrhage, third stage, delivered ? ?Additional problems: none    ?Discharge diagnosis: Term Pregnancy Delivered                                              ?Post partum procedures: NA ?Augmentation: AROM ?Complications: [id number]? ?Hospital course: Onset of Labor With Vaginal Delivery      ?31y.o. yo G3P2002 at 327w2das admitted in Latent Labor on 07/20/2021. Patient had an uncomplicated labor course as follows:  ?Membrane Rupture Time/Date: 3:53 PM ,07/20/2021   ?Delivery Method:Vaginal, Spontaneous  ?Episiotomy: None  ?Lacerations:  1st degree  ?Patient had an uncomplicated postpartum course.  She is ambulating, tolerating a regular diet, passing flatus, and urinating well. Has had some palpitations and SOB when up to the BR, labs WNL.  Pt feels comfortable going home. Patient is discharged home in stable condition on 07/21/21. ? ?Newborn Data: ?Birth date:07/20/2021  ?Birth time:4:44 PM  ?Gender:Female  ?Living status:Living  ?Apgars:8 ,9  ?Weight:3950 g  ? ?Magnesium Sulfate received: No ?BMZ received: No ?Rhophylac:N/A ?MMR:No ?T-DaP: ordered for discharge  ?Flu: No ?Transfusion:No ? ?Physical exam  ?Vitals:  ? 07/20/21 2137 07/20/21 2344 07/21/21 0311 07/21/21 0743  ?BP: (!) 115/53 107/71 113/70 106/66  ?Pulse: 74 90 87 80  ?Resp: '18 18 18 18  ' ?Temp: 98.9 ?F (37.2 ?C) 98.8 ?F (37.1 ?C) 98.8 ?F (37.1 ?C) 97.7 ?F (36.5 ?C)  ?TempSrc: Oral Oral Oral Oral  ?SpO2: 98% 99% 99% 97%  ?Weight:      ?Height:      ? ?General: alert ?Lungs: CTAB ?Heart: RRR,  no murmur, skips or gallops ?Breasts: soft, nipples erect and intact bilaterally  ?Lochia: appropriate ?Uterine Fundus: firm ?Incision: N/A ?Perineum: slightly swollen ?Lochia light rubra ?Extremities: no edema  ?DVT Evaluation: No evidence of DVT seen on physical exam. ?Labs: ?Lab Results  ?Component Value Date  ? WBC 10.1 07/21/2021  ? HGB 8.5 (L) 07/21/2021  ? HCT 26.1 (L) 07/21/2021  ? MCV 93.9 07/21/2021  ? PLT 154 07/21/2021  ? ?No flowsheet data found. ?Edinburgh Score: ?Edinburgh Postnatal Depression Scale Screening Tool 07/21/2021  ?I have been able to laugh and see the funny side of things. 0  ?I have looked forward with enjoyment to things. 0  ?I have blamed myself unnecessarily when things went wrong. 0  ?I have been anxious or worried for no good reason. 0  ?I have felt scared or panicky for no good reason. 0  ?Things have been getting on top of me. 1  ?I have been so unhappy that I have had difficulty sleeping. 0  ?I have felt sad or miserable. 0  ?I have been so unhappy that I have been crying. 0  ?The thought of harming myself has occurred to me. 0  ?Edinburgh Postnatal Depression Scale Total 1  ? ? ? ? ?After visit meds:  ?Allergies as of 07/21/2021   ?No Known Allergies ?  ? ?  ?  Medication List  ?  ? ?TAKE these medications   ? ?acetaminophen 325 MG tablet ?Commonly known as: Tylenol ?Take 2 tablets (650 mg total) by mouth every 4 (four) hours as needed (for pain scale < 4). ?  ?benzocaine-Menthol 20-0.5 % Aero ?Commonly known as: DERMOPLAST ?Apply 1 application. topically as needed for irritation (perineal discomfort). ?  ?coconut oil Oil ?Apply 1 application. topically as needed. ?  ?dibucaine 1 % Oint ?Commonly known as: NUPERCAINAL ?Place 1 application. rectally as needed for hemorrhoids. ?  ?EPINEPHrine 0.3 mg/0.3 mL Soaj injection ?Commonly known as: EPI-PEN ?Inject 0.3 mg into the muscle as needed for anaphylaxis. ?  ?ferrous sulfate 325 (65 FE) MG tablet ?Take 325 mg by mouth every other  day. ?  ?ibuprofen 600 MG tablet ?Commonly known as: ADVIL ?Take 1 tablet (600 mg total) by mouth every 6 (six) hours. ?  ?prenatal multivitamin Tabs tablet ?Take 1 tablet by mouth daily at 12 noon. ?  ?simethicone 80 MG chewable tablet ?Commonly known as: MYLICON ?Chew 1 tablet (80 mg total) by mouth as needed for flatulence. ?  ?witch hazel-glycerin pad ?Commonly known as: TUCKS ?Apply 1 application. topically as needed for hemorrhoids. ?  ? ?  ? ?  ?  ? ? ?  ?Discharge Care Instructions  ?(From admission, onward)  ?  ? ? ?  ? ?  Start     Ordered  ? 07/21/21 0000  Discharge wound care:       ?Comments: SHOWER DAILY ?Wash incision gently with soap and water.  ?Call office with any drainage, redness, or firmness of the incision.  ? 07/21/21 1519  ? ?  ?  ? ?  ? ? ? ?Discharge home in stable condition ?Infant Feeding: Breast ?Infant Disposition:home with mother ?Discharge instruction: per After Visit Summary and Postpartum booklet. ?Activity: Advance as tolerated. Pelvic rest for 6 weeks.  ?Diet: routine diet ?Anticipated Birth Control: Condoms ?Postpartum Appointment:6 weeks ?Additional Postpartum F/U:  NA ?Future Appointments:No future appointments. ?Follow up Visit: ? Follow-up Information   ? ? [facility], [facility]. Schedule an appointment as soon as possible for a visit in 6 week(s).   ?Why: Please call the [facility] and make an appointment for a 6 week postpartum visit. ?Contact information: ?[address] ?[place] [address] ?[phone] ? ? ?  ?  ? ?  ?  ? ?  ? ? ? ?  ? ?07/21/2021 ?Shea Kapur M Mckenzie Toruno, CNM ? ? ?

## 2021-07-21 LAB — CBC
HCT: 27 % — ABNORMAL LOW (ref 36.0–46.0)
Hemoglobin: 8.9 g/dL — ABNORMAL LOW (ref 12.0–15.0)
MCH: 30.7 pg (ref 26.0–34.0)
MCHC: 33 g/dL (ref 30.0–36.0)
MCV: 93.1 fL (ref 80.0–100.0)
Platelets: 151 10*3/uL (ref 150–400)
RBC: 2.9 MIL/uL — ABNORMAL LOW (ref 3.87–5.11)
RDW: 15 % (ref 11.5–15.5)
WBC: 11.3 10*3/uL — ABNORMAL HIGH (ref 4.0–10.5)
nRBC: 0 % (ref 0.0–0.2)

## 2021-07-21 LAB — CBC WITH DIFFERENTIAL/PLATELET
Abs Immature Granulocytes: 0.13 10*3/uL — ABNORMAL HIGH (ref 0.00–0.07)
Basophils Absolute: 0 10*3/uL (ref 0.0–0.1)
Basophils Relative: 0 %
Eosinophils Absolute: 0.1 10*3/uL (ref 0.0–0.5)
Eosinophils Relative: 1 %
HCT: 26.1 % — ABNORMAL LOW (ref 36.0–46.0)
Hemoglobin: 8.5 g/dL — ABNORMAL LOW (ref 12.0–15.0)
Immature Granulocytes: 1 %
Lymphocytes Relative: 18 %
Lymphs Abs: 1.8 10*3/uL (ref 0.7–4.0)
MCH: 30.6 pg (ref 26.0–34.0)
MCHC: 32.6 g/dL (ref 30.0–36.0)
MCV: 93.9 fL (ref 80.0–100.0)
Monocytes Absolute: 0.6 10*3/uL (ref 0.1–1.0)
Monocytes Relative: 6 %
Neutro Abs: 7.4 10*3/uL (ref 1.7–7.7)
Neutrophils Relative %: 74 %
Platelets: 154 10*3/uL (ref 150–400)
RBC: 2.78 MIL/uL — ABNORMAL LOW (ref 3.87–5.11)
RDW: 15 % (ref 11.5–15.5)
WBC: 10.1 10*3/uL (ref 4.0–10.5)
nRBC: 0 % (ref 0.0–0.2)

## 2021-07-21 LAB — RPR: RPR Ser Ql: NONREACTIVE

## 2021-07-21 MED ORDER — SIMETHICONE 80 MG PO CHEW: 80.0000 mg | CHEWABLE_TABLET | ORAL | 0 refills | Status: DC | PRN

## 2021-07-21 MED ORDER — IBUPROFEN 600 MG PO TABS: 600.0000 mg | ORAL_TABLET | Freq: Four times a day (QID) | ORAL | 0 refills | Status: DC

## 2021-07-21 MED ORDER — WITCH HAZEL-GLYCERIN EX PADS: 1.0000 "application " | MEDICATED_PAD | CUTANEOUS | 12 refills | Status: DC | PRN

## 2021-07-21 MED ORDER — BENZOCAINE-MENTHOL 20-0.5 % EX AERO: 1.0000 "application " | INHALATION_SPRAY | CUTANEOUS | Status: DC | PRN

## 2021-07-21 MED ORDER — COCONUT OIL OIL: 1.0000 "application " | TOPICAL_OIL | 0 refills | Status: DC | PRN

## 2021-07-21 MED ORDER — ACETAMINOPHEN 325 MG PO TABS: 650.0000 mg | ORAL_TABLET | ORAL | Status: AC | PRN

## 2021-07-21 MED ORDER — DIBUCAINE (PERIANAL) 1 % EX OINT: 1.0000 "application " | TOPICAL_OINTMENT | CUTANEOUS | Status: DC | PRN

## 2021-07-21 NOTE — Lactation Note (Signed)
This note was copied from a baby's chart. ?Lactation Consultation Note ? ?Patient Name: Casey Floyd ?Today's Date: 07/21/2021 ?Reason for consult: Initial assessment;Term;Other (Comment) (Mom with PPH. Mom would like to D/C home with baby today.) ?Age:32 hours ? ?Maternal Data ?Has patient been taught Hand Expression?:  (Mom independently able to hand express. This is mom's 3rd baby. She breastfed both of her other children who are now 7 years and 13 years old.) ?Does the patient have breastfeeding experience prior to this delivery?: Yes ?How long did the patient breastfeed?: Mom breastfed first child for 2 years and 2nd child for 1.5 years. ? ?Feeding ?Mother's Current Feeding Choice: Breast Milk ? ?LATCH Score ?Latch: Repeated attempts needed to sustain latch, nipple held in mouth throughout feeding, stimulation needed to elicit sucking reflex. (Recommended mom flange upper lip outward to maximize latch.) ? ?Audible Swallowing: Spontaneous and intermittent (Baby with audible swallows .Discussed nutritive vs. non-nutritive sucking.) ? ?Type of Nipple: Everted at rest and after stimulation ? ?Comfort (Breast/Nipple): Soft / non-tender ? ?Hold (Positioning): No assistance needed to correctly position infant at breast. ? ?LATCH Score: 9 ? ? ? ?  ?Interventions ?Interventions: Breast feeding basics reviewed;Assisted with latch;Education ? ?Discharge ?Discharge Education: Engorgement and breast care;Warning signs for feeding baby;Other (comment) (Discharge lactation teaching done. Discussed how to know baby is getting enough, number of feeds/stools and urine diapers.Refered parents to mother-baby education booklet to refer to. Discussed in detail normative lactogenesis as mom had PPH.) ?Pump:  (Mom reports she has a manual pump somewhere at home. Declines wanting a manual pump today.) ? ?Consult Status ?Consult Status: PRN (Anticipated discharge. Mother provided outpatient [facility] office number if she has additional  questions.) ? ? ? ?Fuller Song ?07/21/2021, 2:40 PM ? ? ? ?

## 2021-07-21 NOTE — Progress Notes (Signed)
? ?Subjective:  ?Doing ok. Reports feeling SOB and palpitations whenever she gets up to the BR. Voiding without difficulty, bleeding WNL.  Breastfeeding independently, LC has been in to assess. Pain managed with PRN medications.  ? ?Objective:  ?Vital signs in last 24 hours: ?Temp:  [97.7 ?F (36.5 ?C)-98.9 ?F (37.2 ?C)] 97.7 ?F (36.5 ?C) (03/15 1740) ?Pulse Rate:  [74-125] 80 (03/15 0743) ?Resp:  [18] 18 (03/15 0743) ?BP: (106-148)/(40-89) 106/66 (03/15 0743) ?SpO2:  [97 %-99 %] 97 % (03/15 0743) ?  ? ?General: NAD ?Skin: color pink  ?Pulmonary: no increased work of breathing, CTAB ?Breasts: soft, nipples erect and intact bilaterally.  ?Abdomen: non-distended, non-tender, fundus firm at level of umbilicus ?Perineum: minimal swelling ?Extremities: no edema, no erythema, no tenderness ? ?Results for orders placed or performed during the hospital encounter of 07/20/21 (from the past 72 hour(s))  ?CBC     Status: None  ? Collection Time: 07/20/21 12:22 PM  ?Result Value Ref Range  ? WBC 9.3 4.0 - 10.5 K/uL  ? RBC 4.24 3.87 - 5.11 MIL/uL  ? Hemoglobin 13.0 12.0 - 15.0 g/dL  ? HCT 39.2 36.0 - 46.0 %  ? MCV 92.5 80.0 - 100.0 fL  ? MCH 30.7 26.0 - 34.0 pg  ? MCHC 33.2 30.0 - 36.0 g/dL  ? RDW 14.9 11.5 - 15.5 %  ? Platelets 163 150 - 400 K/uL  ? nRBC 0.0 0.0 - 0.2 %  ?  Comment: Performed at [facility], [address] [place], [place] [address]  ?Type and screen [facility]     Status: None  ? Collection Time: 07/20/21 12:22 PM  ?Result Value Ref Range  ? ABO/RH(D) O POS   ? Antibody Screen NEG   ? Sample Expiration    ?  07/23/2021,2359 ?Performed at [facility], [address], [place], [place] [address] ?  ?RPR     Status: None  ? Collection Time: 07/20/21 12:22 PM  ?Result Value Ref Range  ? RPR Ser Ql NON REACTIVE NON REACTIVE  ?  Comment: Performed at [facility], [address] [place], [place] [address]  ?Resp Panel by RT-PCR (Flu A&B, Covid) Nasopharyngeal Swab      Status: None  ? Collection Time: 07/20/21 12:22 PM  ? Specimen: Nasopharyngeal Swab; Nasopharyngeal(NP) swabs in vial transport medium  ?Result Value Ref Range  ? SARS Coronavirus 2 by RT PCR NEGATIVE NEGATIVE  ?  Comment: (NOTE) ?SARS-CoV-2 target nucleic acids are NOT DETECTED. ? ?The SARS-CoV-2 RNA is generally detectable in upper respiratory ?specimens during the acute phase of infection. The lowest ?concentration of SARS-CoV-2 viral copies this assay can detect is ?138 copies/mL. A negative result does not preclude SARS-Cov-2 ?infection and should not be used as the sole basis for treatment or ?other patient management decisions. A negative result may occur with  ?improper specimen collection/handling, submission of specimen other ?than nasopharyngeal swab, presence of viral mutation(s) within the ?areas targeted by this assay, and inadequate number of viral ?copies(<138 copies/mL). A negative result must be combined with ?clinical observations, patient history, and epidemiological ?information. The expected result is Negative. ? ?Fact Sheet for Patients:  ?BloggerCourse.com ? ?Fact Sheet for Healthcare Providers:  ?SeriousBroker.it ? ?This test is no t yet approved or cleared by the [place] FDA and  ?has been authorized for detection and/or diagnosis of SARS-CoV-2 by ?FDA under an Emergency Use Authorization (EUA). This EUA will remain  ?in effect (meaning  this test can be used) for the duration of the ?COVID-19 declaration under Section 564(b)(1) of the Act, 21 ?U.S.C.section 360bbb-3(b)(1), unless the authorization is terminated  ?or revoked sooner.  ? ? ?  ? Influenza A by PCR NEGATIVE NEGATIVE  ? Influenza B by PCR NEGATIVE NEGATIVE  ?  Comment: (NOTE) ?The Xpert Xpress SARS-CoV-2/FLU/RSV plus assay is intended as an aid ?in the diagnosis of influenza from Nasopharyngeal swab specimens and ?should not be used as a sole basis for treatment. Nasal  washings and ?aspirates are unacceptable for Xpert Xpress SARS-CoV-2/FLU/RSV ?testing. ? ?Fact Sheet for Patients: ?BloggerCourse.com ? ?Fact Sheet for Healthcare Providers: ?SeriousBroker.it ? ?This test is not yet approved or cleared by the [place] FDA and ?has been authorized for detection and/or diagnosis of SARS-CoV-2 by ?FDA under an Emergency Use Authorization (EUA). This EUA will remain ?in effect (meaning this test can be used) for the duration of the ?COVID-19 declaration under Section 564(b)(1) of the Act, 21 U.S.C. ?section 360bbb-3(b)(1), unless the authorization is terminated or ?revoked. ? ?Performed at [facility], [address] [place], ?[place] [address] ?  ?CBC     Status: Abnormal  ? Collection Time: 07/20/21 11:03 PM  ?Result Value Ref Range  ? WBC 15.2 (H) 4.0 - 10.5 K/uL  ? RBC 3.18 (L) 3.87 - 5.11 MIL/uL  ? Hemoglobin 9.8 (L) 12.0 - 15.0 g/dL  ? HCT 29.5 (L) 36.0 - 46.0 %  ? MCV 92.8 80.0 - 100.0 fL  ? MCH 30.8 26.0 - 34.0 pg  ? MCHC 33.2 30.0 - 36.0 g/dL  ? RDW 14.8 11.5 - 15.5 %  ? Platelets 158 150 - 400 K/uL  ? nRBC 0.0 0.0 - 0.2 %  ?  Comment: Performed at [facility], [address], [place], [place] [address]  ?CBC     Status: Abnormal  ? Collection Time: 07/21/21  6:16 AM  ?Result Value Ref Range  ? WBC 11.3 (H) 4.0 - 10.5 K/uL  ? RBC 2.90 (L) 3.87 - 5.11 MIL/uL  ? Hemoglobin 8.9 (L) 12.0 - 15.0 g/dL  ? HCT 27.0 (L) 36.0 - 46.0 %  ? MCV 93.1 80.0 - 100.0 fL  ? MCH 30.7 26.0 - 34.0 pg  ? MCHC 33.0 30.0 - 36.0 g/dL  ? RDW 15.0 11.5 - 15.5 %  ? Platelets 151 150 - 400 K/uL  ? nRBC 0.0 0.0 - 0.2 %  ?  Comment: Performed at [facility], [address], [place], [place] [address]  ? ? ?Assessment:  ? 31 y.o. G3P2002 postpartum day # 1 ? ?Plan:  ? ? ?1) Acute blood loss anemia (EBL 1680) -Has SOB and palpitations when up, stat CBC ordered-consider blood transfusion. ?- po ferrous sulfate ? ?2) Blood  Type --/--/O POS (03/14 1222) / Rubella Immune (09/28 0000) / Varicella Immune ? ?3) TDAP status administer prior to discharge ? ?4) Feeding plan breast ? ?5)  Education given regarding options for contraception, as well as compatibility with breast feeding if applicable.  Patient plans on condoms for contraception. ? ?6) Disposition anticipate discharge later today ? ?Carie Caddy, CNM  ?[facility] ?07/21/2021, 2:09 PM ? ?  ?

## 2021-07-21 NOTE — Progress Notes (Signed)
Patient discharged home with infant. Rx sent to pharmacy. PP instructions given and reviewed with patient. Patient verbalized understanding. Patient escorted out by auxiliary.  ? ?Erlene Quan ?07/21/2021 ?5:16 PM ? ?

## 2021-07-23 ENCOUNTER — Other Ambulatory Visit: Payer: Self-pay

## 2021-07-23 DIAGNOSIS — O26843 Uterine size-date discrepancy, third trimester: Secondary | ICD-10-CM | POA: Insufficient documentation

## 2021-07-24 ENCOUNTER — Emergency Department: Payer: Medicaid Other

## 2021-07-24 ENCOUNTER — Emergency Department
Admission: EM | Admit: 2021-07-24 | Discharge: 2021-07-24 | Disposition: A | Discharge status: Home / Self Care | Payer: Medicaid Other | Attending: Emergency Medicine | Admitting: Emergency Medicine

## 2021-07-24 ENCOUNTER — Other Ambulatory Visit: Payer: Self-pay

## 2021-07-24 DIAGNOSIS — R0600 Dyspnea, unspecified: Secondary | ICD-10-CM | POA: Insufficient documentation

## 2021-07-24 DIAGNOSIS — O99893 Other specified diseases and conditions complicating puerperium: Secondary | ICD-10-CM | POA: Diagnosis present

## 2021-07-24 DIAGNOSIS — R0781 Pleurodynia: Secondary | ICD-10-CM | POA: Diagnosis not present

## 2021-07-24 DIAGNOSIS — R600 Localized edema: Secondary | ICD-10-CM | POA: Diagnosis not present

## 2021-07-24 DIAGNOSIS — R791 Abnormal coagulation profile: Secondary | ICD-10-CM | POA: Diagnosis not present

## 2021-07-24 DIAGNOSIS — O9081 Anemia of the puerperium: Secondary | ICD-10-CM | POA: Insufficient documentation

## 2021-07-24 DIAGNOSIS — R079 Chest pain, unspecified: Secondary | ICD-10-CM

## 2021-07-24 LAB — CBC
HCT: 27.5 % — ABNORMAL LOW (ref 36.0–46.0)
Hemoglobin: 8.8 g/dL — ABNORMAL LOW (ref 12.0–15.0)
MCH: 31.1 pg (ref 26.0–34.0)
MCHC: 32 g/dL (ref 30.0–36.0)
MCV: 97.2 fL (ref 80.0–100.0)
Platelets: 228 10*3/uL (ref 150–400)
RBC: 2.83 MIL/uL — ABNORMAL LOW (ref 3.87–5.11)
RDW: 15.4 % (ref 11.5–15.5)
WBC: 10.2 10*3/uL (ref 4.0–10.5)
nRBC: 0.3 % — ABNORMAL HIGH (ref 0.0–0.2)

## 2021-07-24 LAB — BASIC METABOLIC PANEL
Anion gap: 10 (ref 5–15)
BUN: 12 mg/dL (ref 6–20)
CO2: 24 mmol/L (ref 22–32)
Calcium: 8.7 mg/dL — ABNORMAL LOW (ref 8.9–10.3)
Chloride: 105 mmol/L (ref 98–111)
Creatinine, Ser: 0.61 mg/dL (ref 0.44–1.00)
GFR, Estimated: >60 mL/min (ref >60)
Glucose, Bld: 95 mg/dL (ref 70–99)
Potassium: 4 mmol/L (ref 3.5–5.1)
Sodium: 139 mmol/L (ref 135–145)

## 2021-07-24 LAB — D-DIMER, QUANTITATIVE: D-Dimer, Quant: 1.13 ug/mL-FEU — ABNORMAL HIGH (ref 0.00–0.50)

## 2021-07-24 LAB — TROPONIN I (HIGH SENSITIVITY): Troponin I (High Sensitivity): 4 ng/L (ref <18)

## 2021-07-24 MED ORDER — IOHEXOL 350 MG/ML SOLN
60.0000 mL | Freq: Once | INTRAVENOUS | Status: AC | PRN
  Administered 2021-07-24: 60 mL via INTRAVENOUS
  Filled 2021-07-24: qty 60

## 2021-07-24 NOTE — Discharge Instructions (Addendum)
Your blood work and your CAT scan today was reassuring.  We did not find a blood clot in your lungs.  You can take Tylenol and Motrin as needed for pain.  Please follow-up with your primary care provider. ?

## 2021-07-24 NOTE — ED Provider Notes (Signed)
? ?[facility] ?Provider Note ? ? ? Event Date/Time  ? First MD Initiated Contact with Patient 07/24/21 1249   ?  (approximate) ? ? ?History  ? ?Chest Pain ? ? ?HPI ? ?Casey Floyd is a 31 y.o. female who is 4 days postpartum presents with chest pain. ?Reviewed from recent discharge summary shows that patient had a vaginal delivery at 39 weeks and 2 days which was complicated by postpartum hemorrhage greater than 1 L of blood loss.  Patient notes that while she was in the hospital after delivery she did have some occasional chest pain but mainly with ambulation with some associated shortness of breath but it would go away when she sat down.  Since being home pain is now both at rest and with walking.  Not clearly exertional sometimes pleuritic.  She has mild dyspnea.  Denies cough or hemoptysis.  No fevers or chills.  Has bilateral leg swelling since the end of pregnancy which is symmetric.  No history of DVT/PE or family history of clotting disorder. ?  ? ?History reviewed. No pertinent past medical history. ? ?Patient Active Problem List  ? Diagnosis Date Noted  ? Uterine size date discrepancy pregnancy, third trimester 07/23/2021  ? History of postpartum hemorrhage, currently pregnant in third trimester 07/20/2021  ? ? ? ?Physical Exam  ?Triage Vital Signs: ?ED Triage Vitals [07/24/21 1244]  ?Enc Vitals Group  ?   BP 129/77  ?   Pulse Rate 86  ?   Resp 18  ?   Temp 99.2 ?F (37.3 ?C)  ?   Temp Source Oral  ?   SpO2 96 %  ?   Weight   ?   Height 5\' 2"  (1.575 m)  ?   Head Circumference   ?   Peak Flow   ?   Pain Score 5  ?   Pain Loc   ?   Pain Edu?   ?   Excl. in [place]?   ? ? ?Most recent vital signs: ?Vitals:  ? 07/24/21 1244  ?BP: 129/77  ?Pulse: 86  ?Resp: 18  ?Temp: 99.2 ?F (37.3 ?C)  ?SpO2: 96%  ? ? ? ?General: Awake, no distress.  ?CV:  Good peripheral perfusion.  Mild peripheral edema bilaterally, symmetric ?Resp:  Normal effort.  Lungs are clear bilaterally ?Abd:  No distention.   ?Neuro:             Awake, Alert, Oriented x 3  ?Other:   ? ? ?ED Results / Procedures / Treatments  ?Labs ?(all labs ordered are listed, but only abnormal results are displayed) ?Labs Reviewed  ?BASIC METABOLIC PANEL - Abnormal; Notable for the following components:  ?    Result Value  ? Calcium 8.7 (*)   ? All other components within normal limits  ?CBC - Abnormal; Notable for the following components:  ? RBC 2.83 (*)   ? Hemoglobin 8.8 (*)   ? HCT 27.5 (*)   ? nRBC 0.3 (*)   ? All other components within normal limits  ?D-DIMER, QUANTITATIVE - Abnormal; Notable for the following components:  ? D-Dimer, Quant 1.13 (*)   ? All other components within normal limits  ?POC URINE PREG, ED  ?TROPONIN I (HIGH SENSITIVITY)  ? ? ? ?EKG ? ?EKG interpretation performed by myself: NSR, nml axis, nml intervals, no acute ischemic changes ? ? ? ?RADIOLOGY ?I reviewed the CXR which does not show any acute cardiopulmonary process; agree with radiology  report  ? ? ? ?PROCEDURES: ? ?Critical Care performed: No ? ?Procedures ? ?The patient is on the cardiac monitor to evaluate for evidence of arrhythmia and/or significant heart rate changes. ? ? ?MEDICATIONS ORDERED IN ED: ?Medications  ?iohexol (OMNIPAQUE) 350 MG/ML injection 60 mL (60 mLs Intravenous Contrast Given 07/24/21 1454)  ? ? ? ?IMPRESSION / MDM / ASSESSMENT AND PLAN / ED COURSE  ?I reviewed the triage vital signs and the nursing notes. ?             ?               ? ?Differential diagnosis includes, but is not limited to, pleurisy, pulmonary embolism, pneumonia, musculoskeletal ? ?The patient is a 31 year old female who is 4 days postpartum after postpartum hemorrhage during vaginal delivery who presents with chest pain.  Pain started during hospitalization but is becoming more frequent is now somewhat pleuritic occurring both on exertion and at rest without clear exacerbating factor.  She looks well and her vital signs are stable.  Lungs are clear no obvious lower  extremity asymmetry.  Her hemoglobin is 8.8 which is higher than it was at the time of discharge.  Given she is postpartum I am concerned about potential PE.  Patient prefers to start with a D-dimer though I did tell her that it is likely to be elevated in the setting of pregnancy we may still need to get a CTA she was okay with this.  Her EKG does not show any obvious ischemic changes.  CXR w/o acute abnormality.  ? ?D dimer is 1.13, will proceed to CTA.  ? ?CTA is negative for PE or other acute process.  Given patient's normal vital signs and otherwise reassuring work-up today I think she is appropriate discharge outpatient follow-up. ? ?Clinical Course as of 07/24/21 1511  ?Sat Jul 24, 2021  ?1301 POC urine preg, ED [KM]  ?1432 D-Dimer, Quant(!): 1.13 [KM]  ?  ?Clinical Course User Index ?[KM] Rada Hay, MD  ? ? ? ?FINAL CLINICAL IMPRESSION(S) / ED DIAGNOSES  ? ?Final diagnoses:  ?Chest pain, unspecified type  ? ? ? ?Rx / DC Orders  ? ?ED Discharge Orders   ? ? None  ? ?  ? ? ? ?Note:  This document was prepared using Dragon voice recognition software and may include unintentional dictation errors. ?  ?Rada Hay, MD ?07/24/21 1511 ? ?

## 2021-07-24 NOTE — ED Triage Notes (Signed)
Patient to ER via POV with complaints of chest pain that radiates into her upper back. Reports this started one day after labor, states she was having shortness of breath and some chest pain when standing up. Pain is now constant. Patient had baby 3/14, states she was told there was some excess bleeding but it was an uncomplicated delivery.  ? ?

## 2024-03-12 ENCOUNTER — Ambulatory Visit
Admission: RE | Admit: 2024-03-12 | Discharge: 2024-03-12 | Disposition: A | Discharge status: Home / Self Care | Source: Ambulatory Visit | Attending: Family Medicine | Admitting: Family Medicine

## 2024-03-12 ENCOUNTER — Ambulatory Visit
Admission: RE | Admit: 2024-03-12 | Discharge: 2024-03-12 | Disposition: A | Discharge status: Home / Self Care | Attending: Family Medicine | Admitting: Family Medicine

## 2024-03-12 ENCOUNTER — Other Ambulatory Visit: Payer: Self-pay | Admitting: Family Medicine

## 2024-03-12 DIAGNOSIS — R0600 Dyspnea, unspecified: Secondary | ICD-10-CM
